# Patient Record
Sex: Male | Born: 1995 | Race: Black or African American | Hispanic: No | Marital: Single | Smoking: Never smoker
Health system: Southern US, Community
[De-identification: ages and names within clinical notes are randomized; demographics above are authoritative.]

## PROBLEM LIST (undated history)

## (undated) HISTORY — PX: NO PAST SURGERIES: SHX2092

---
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
## 2016-07-26 ENCOUNTER — Emergency Department (HOSPITAL_COMMUNITY): Payer: Medicaid Other

## 2016-07-26 ENCOUNTER — Emergency Department (HOSPITAL_COMMUNITY)
Admission: EM | Admit: 2016-07-26 | Discharge: 2016-07-26 | Disposition: A | Discharge status: Home / Self Care | Payer: Medicaid Other | Attending: Emergency Medicine | Admitting: Emergency Medicine

## 2016-07-26 ENCOUNTER — Encounter (HOSPITAL_COMMUNITY): Payer: Self-pay | Admitting: *Deleted

## 2016-07-26 DIAGNOSIS — S29011A Strain of muscle and tendon of front wall of thorax, initial encounter: Secondary | ICD-10-CM | POA: Diagnosis not present

## 2016-07-26 DIAGNOSIS — Y929 Unspecified place or not applicable: Secondary | ICD-10-CM | POA: Diagnosis not present

## 2016-07-26 DIAGNOSIS — Y9389 Activity, other specified: Secondary | ICD-10-CM | POA: Diagnosis not present

## 2016-07-26 DIAGNOSIS — X509XXA Other and unspecified overexertion or strenuous movements or postures, initial encounter: Secondary | ICD-10-CM | POA: Insufficient documentation

## 2016-07-26 DIAGNOSIS — S299XXA Unspecified injury of thorax, initial encounter: Secondary | ICD-10-CM | POA: Diagnosis present

## 2016-07-26 DIAGNOSIS — Y999 Unspecified external cause status: Secondary | ICD-10-CM | POA: Insufficient documentation

## 2016-07-26 MED ORDER — IBUPROFEN 600 MG PO TABS
600.0000 mg | ORAL_TABLET | Freq: Four times a day (QID) | ORAL | 0 refills | Status: AC | PRN
Start: 2016-07-26 — End: ?

## 2016-07-26 MED ORDER — METHOCARBAMOL 500 MG PO TABS: 500.0000 mg | ORAL_TABLET | Freq: Every evening | ORAL | 0 refills | Status: DC | PRN

## 2016-07-26 MED ORDER — IBUPROFEN 200 MG PO TABS
600.0000 mg | ORAL_TABLET | Freq: Once | ORAL | Status: AC
  Administered 2016-07-26: 600 mg via ORAL
  Filled 2016-07-26: qty 1

## 2016-07-26 NOTE — ED Provider Notes (Signed)
[facility]-EMERGENCY DEPT Provider Note   CSN: [id number] Arrival date & time: 07/26/16  1925  By signing my name below, I, Linna DarnerRussell Turner, attest that this documentation has been prepared under the direction and in the presence of Terance HartKelly Tallie Hevia, PA-C. Electronically Signed: Linna Darnerussell Turner, Scribe. 07/26/2016. 8:36 PM.  History   Chief Complaint Chief Complaint  Patient presents with  . Shoulder Injury    The history is provided by the patient. No language interpreter was used.     HPI Comments: Jim Shaffer is a 20 y.o. male who presents to the Emergency Department complaining of sudden onset, constant, anterior right shoulder pain beginning shortly PTA. Pt reports he was bench pressing and felt something rip in his right shoulder. He notes pain in his upper right chest area as well. Pt endorses severe pain with raising his right arm and states he cannot raise it very high due to pain. He denies h/o right shoulder injury. No medications or treatments tried PTA. He denies numbness, color change, wounds, right elbow pain, right forearm pain, or any other associated symptoms. He states he has been to [facility] in the past and intends to follow up with them soon.  History reviewed. No pertinent past medical history.  There are no active problems to display for this patient.   History reviewed. No pertinent surgical history.     Home Medications    Prior to Admission medications   Medication Sig Start Date End Date Taking? Authorizing Provider  HYDROcodone-acetaminophen (NORCO/VICODIN) 5-325 MG per tablet Take 1 tablet by mouth every 4 (four) hours as needed for pain. 06/07/12   Viviano SimasLauren Robinson, NP    Family History No family history on file.  Social History Social History  Substance Use Topics  . Smoking status: Never Smoker  . Smokeless tobacco: Never Used  . Alcohol use No     Allergies   Patient has no known allergies.   Review of Systems Review of Systems    Cardiovascular: Positive for chest pain (upper right).  Musculoskeletal: Positive for myalgias (right shoulder).  Skin: Negative for color change and wound.  Neurological: Negative for numbness.     Physical Exam Updated Vital Signs BP 137/92 (BP Location: Left Arm)   Pulse 84   Temp 98.3 F (36.8 C) (Oral)   Resp 20   Ht 6\' 1"  (1.854 m)   Wt 166 lb (75.3 kg)   SpO2 97%   BMI 21.90 kg/m   Physical Exam  Constitutional: He is oriented to person, place, and time. He appears well-developed and well-nourished. No distress.  HENT:  Head: Normocephalic and atraumatic.  Eyes: Conjunctivae and EOM are normal.  Neck: Neck supple. No tracheal deviation present.  Cardiovascular: Normal rate.   Pulmonary/Chest: Effort normal. No respiratory distress.  Musculoskeletal: Normal range of motion.  Right shoulder and right chest wall: No swelling appreciated. Tenderness of anterior shoulder and pectoralis muscle. Decreased ROM of shoulder due to pain. 4/5 strength of right extremity. N/V intact  Neurological: He is alert and oriented to person, place, and time.  Skin: Skin is warm and dry.  Psychiatric: He has a normal mood and affect. His behavior is normal.  Nursing note and vitals reviewed.    ED Treatments / Results  Labs (all labs ordered are listed, but only abnormal results are displayed) Labs Reviewed - No data to display  EKG  EKG Interpretation None       Radiology Dg Shoulder Right  Result Date: 07/26/2016 CLINICAL  DATA:  Right pectoral pain after bench pressing felt a tear EXAM: RIGHT SHOULDER - 2+ VIEW COMPARISON:  06/07/2012 FINDINGS: There is no evidence of fracture or dislocation. There is no evidence of arthropathy or other focal bone abnormality. Soft tissues are unremarkable. IMPRESSION: Negative. Electronically Signed   By: Jasmine Pang M.D.   On: 07/26/2016 21:19    Procedures Procedures (including critical care time)  DIAGNOSTIC STUDIES: Oxygen  Saturation is 97% on RA, normal by my interpretation.    COORDINATION OF CARE: 8:40 PM Discussed treatment plan with pt at bedside and pt agreed to plan.  Medications Ordered in ED Medications - No data to display   Initial Impression / Assessment and Plan / ED Course  I have reviewed the triage vital signs and the nursing notes.  Pertinent labs & imaging results that were available during my care of the patient were reviewed by me and considered in my medical decision making (see chart for details).  Clinical Course    20 year old male with R pectoralis strain vs ligament tear. Xray negative. Sling given. Pain treated in ED. He is an established patient with [facility]. Advised pain medicine, muscle relaxer, and follow up. Patient is NAD, non-toxic, with stable VS. Patient is informed of clinical course, understands medical decision making process, and agrees with plan. Opportunity for questions provided and all questions answered. Return precautions given.   I personally performed the services described in this documentation, which was scribed in my presence. The recorded information has been reviewed and is accurate.   Final Clinical Impressions(s) / ED Diagnoses   Final diagnoses:  Strain of right pectoralis muscle, initial encounter    New Prescriptions New Prescriptions   No medications on file     Bethel Born, PA-C 07/26/16 2217    Eber Hong, MD 07/27/16 2125

## 2016-07-26 NOTE — Discharge Instructions (Signed)
Rest as much as possible Ice for 20 minutes at a time, several times a day Use sling for comfort Take Ibuprofen and muscle relaxer Follow up with Orthopedics

## 2016-07-26 NOTE — ED Triage Notes (Signed)
The pt is c/o pain in his rt shoulder just pta  He was ilfting 250lbs of weight when he felt something tear  Pain with movement and his movement is limited.  He normally lifts 185 lbs

## 2016-08-31 ENCOUNTER — Encounter (HOSPITAL_BASED_OUTPATIENT_CLINIC_OR_DEPARTMENT_OTHER): Payer: Self-pay | Admitting: *Deleted

## 2016-09-06 NOTE — H&P (Signed)
[facility] [address]   [address] [place] [id number] [phone] A Division of [facility]                                                                    RE: Jim Shaffer, Jim   [phone]   11-30-1995 08-26-16 Reason for visit: Follow up, referral from Dr. [facility], with a right pec muscle rupture.   History of present illness: This happened in mid November.  He was bench pressing and felt a tear and pop in his shoulder.  He has had bruising and swelling down his arm.  He has an MRI that demonstrates a mildly retracted torn pec muscle from his humerus.   EXAMINATION: Well appearing male in no apparent distress.  He has a palpable defect in his pec with some sag of this.  He has a palpable tendon stump as well in his chest.  He has limited strength and motion.    X-RAYS: MRI results noted above.  ASSESSMENT/PLAN: Given his young age and active lifestyle we recommend repair of this.  I discussed this with Dr. Richardson Landryan [facility] who will assist with this.    Jewel Baizeimothy D.  [facility]ul[facility], M.D.  Electronically verified by Jewel Baizeimothy D. [facility]ulah [facility], M.D. TDM:jjh D 08-26-16 T 08-31-16

## 2016-09-08 ENCOUNTER — Encounter (HOSPITAL_BASED_OUTPATIENT_CLINIC_OR_DEPARTMENT_OTHER)
Admission: RE | Disposition: A | Discharge status: Home / Self Care | Payer: Self-pay | Source: Ambulatory Visit | Attending: Orthopedic Surgery

## 2016-09-08 ENCOUNTER — Ambulatory Visit (HOSPITAL_BASED_OUTPATIENT_CLINIC_OR_DEPARTMENT_OTHER): Payer: Medicaid Other | Admitting: Anesthesiology

## 2016-09-08 ENCOUNTER — Ambulatory Visit (HOSPITAL_BASED_OUTPATIENT_CLINIC_OR_DEPARTMENT_OTHER)
Admission: RE | Admit: 2016-09-08 | Discharge: 2016-09-08 | Disposition: A | Discharge status: Home / Self Care | Payer: Medicaid Other | Source: Ambulatory Visit | Attending: Orthopedic Surgery | Admitting: Orthopedic Surgery

## 2016-09-08 ENCOUNTER — Encounter (HOSPITAL_BASED_OUTPATIENT_CLINIC_OR_DEPARTMENT_OTHER): Payer: Self-pay | Admitting: *Deleted

## 2016-09-08 DIAGNOSIS — S29011A Strain of muscle and tendon of front wall of thorax, initial encounter: Secondary | ICD-10-CM

## 2016-09-08 DIAGNOSIS — X503XXA Overexertion from repetitive movements, initial encounter: Secondary | ICD-10-CM | POA: Insufficient documentation

## 2016-09-08 DIAGNOSIS — S46811A Strain of other muscles, fascia and tendons at shoulder and upper arm level, right arm, initial encounter: Secondary | ICD-10-CM | POA: Insufficient documentation

## 2016-09-08 HISTORY — PX: PECTORALIS TENDON REPAIR: SHX6510

## 2016-09-08 SURGERY — REPAIR, TENDON, PECTORALIS
Anesthesia: Regional | Laterality: Right

## 2016-09-08 MED ORDER — ACETAMINOPHEN 500 MG PO TABS
1000.0000 mg | ORAL_TABLET | Freq: Once | ORAL | Status: AC
  Administered 2016-09-08: 1000 mg via ORAL

## 2016-09-08 MED ORDER — BUPIVACAINE HCL (PF) 0.25 % IJ SOLN
INTRAMUSCULAR | Status: AC
  Filled 2016-09-08: qty 30

## 2016-09-08 MED ORDER — CEFAZOLIN SODIUM-DEXTROSE 2-4 GM/100ML-% IV SOLN
INTRAVENOUS | Status: AC
  Filled 2016-09-08: qty 100

## 2016-09-08 MED ORDER — FENTANYL CITRATE (PF) 100 MCG/2ML IJ SOLN
50.0000 ug | INTRAMUSCULAR | Status: DC | PRN
  Administered 2016-09-08: 100 ug via INTRAVENOUS
  Administered 2016-09-08: 50 ug via INTRAVENOUS

## 2016-09-08 MED ORDER — ARTIFICIAL TEARS OP OINT
TOPICAL_OINTMENT | OPHTHALMIC | Status: DC | PRN
  Administered 2016-09-08: 1 via OPHTHALMIC

## 2016-09-08 MED ORDER — SCOPOLAMINE 1 MG/3DAYS TD PT72
MEDICATED_PATCH | TRANSDERMAL | Status: AC
  Filled 2016-09-08: qty 1

## 2016-09-08 MED ORDER — MIDAZOLAM HCL 2 MG/2ML IJ SOLN
1.0000 mg | INTRAMUSCULAR | Status: DC | PRN
  Administered 2016-09-08: 2 mg via INTRAVENOUS

## 2016-09-08 MED ORDER — CEFAZOLIN SODIUM-DEXTROSE 2-4 GM/100ML-% IV SOLN
2.0000 g | INTRAVENOUS | Status: AC
  Administered 2016-09-08: 2 g via INTRAVENOUS

## 2016-09-08 MED ORDER — ACETAMINOPHEN 500 MG PO TABS
ORAL_TABLET | ORAL | Status: AC
  Filled 2016-09-08: qty 2

## 2016-09-08 MED ORDER — CHLORHEXIDINE GLUCONATE 4 % EX LIQD: 60.0000 mL | Freq: Once | CUTANEOUS | Status: DC

## 2016-09-08 MED ORDER — SUCCINYLCHOLINE CHLORIDE 20 MG/ML IJ SOLN
INTRAMUSCULAR | Status: DC | PRN
  Administered 2016-09-08: 120 mg via INTRAVENOUS

## 2016-09-08 MED ORDER — SCOPOLAMINE 1 MG/3DAYS TD PT72
1.0000 | MEDICATED_PATCH | Freq: Once | TRANSDERMAL | Status: DC | PRN
  Administered 2016-09-08: 1.5 mg via TRANSDERMAL

## 2016-09-08 MED ORDER — LACTATED RINGERS IV SOLN
INTRAVENOUS | Status: DC
  Administered 2016-09-08: 07:00:00 via INTRAVENOUS

## 2016-09-08 MED ORDER — METHOCARBAMOL 500 MG PO TABS: 500.0000 mg | ORAL_TABLET | Freq: Four times a day (QID) | ORAL | 0 refills | Status: AC | PRN

## 2016-09-08 MED ORDER — DEXAMETHASONE SODIUM PHOSPHATE 10 MG/ML IJ SOLN
INTRAMUSCULAR | Status: AC
  Filled 2016-09-08: qty 1

## 2016-09-08 MED ORDER — LACTATED RINGERS IV SOLN
INTRAVENOUS | Status: DC
  Administered 2016-09-08 (×2): via INTRAVENOUS

## 2016-09-08 MED ORDER — ONDANSETRON HCL 4 MG/2ML IJ SOLN
INTRAMUSCULAR | Status: AC
  Filled 2016-09-08: qty 2

## 2016-09-08 MED ORDER — ONDANSETRON HCL 4 MG/2ML IJ SOLN
INTRAMUSCULAR | Status: DC | PRN
  Administered 2016-09-08: 4 mg via INTRAVENOUS

## 2016-09-08 MED ORDER — PROMETHAZINE HCL 25 MG/ML IJ SOLN: 6.2500 mg | INTRAMUSCULAR | Status: DC | PRN

## 2016-09-08 MED ORDER — PROPOFOL 500 MG/50ML IV EMUL
INTRAVENOUS | Status: AC
  Filled 2016-09-08: qty 50

## 2016-09-08 MED ORDER — FENTANYL CITRATE (PF) 100 MCG/2ML IJ SOLN
INTRAMUSCULAR | Status: AC
  Filled 2016-09-08: qty 2

## 2016-09-08 MED ORDER — BUPIVACAINE-EPINEPHRINE (PF) 0.5% -1:200000 IJ SOLN
INTRAMUSCULAR | Status: DC | PRN
  Administered 2016-09-08: 30 mL via PERINEURAL

## 2016-09-08 MED ORDER — HYDROMORPHONE HCL 2 MG PO TABS: 2.0000 mg | ORAL_TABLET | Freq: Two times a day (BID) | ORAL | 0 refills | Status: AC | PRN

## 2016-09-08 MED ORDER — DEXAMETHASONE SODIUM PHOSPHATE 4 MG/ML IJ SOLN
INTRAMUSCULAR | Status: DC | PRN
  Administered 2016-09-08: 10 mg via INTRAVENOUS

## 2016-09-08 MED ORDER — LIDOCAINE 2% (20 MG/ML) 5 ML SYRINGE
INTRAMUSCULAR | Status: AC
  Filled 2016-09-08: qty 5

## 2016-09-08 MED ORDER — HYDROMORPHONE HCL 1 MG/ML IJ SOLN
INTRAMUSCULAR | Status: AC
  Filled 2016-09-08: qty 1

## 2016-09-08 MED ORDER — PROPOFOL 10 MG/ML IV BOLUS
INTRAVENOUS | Status: DC | PRN
  Administered 2016-09-08: 200 mg via INTRAVENOUS

## 2016-09-08 MED ORDER — OXYCODONE-ACETAMINOPHEN 5-325 MG PO TABS: 1.0000 | ORAL_TABLET | ORAL | 0 refills | Status: AC | PRN

## 2016-09-08 MED ORDER — SUCCINYLCHOLINE CHLORIDE 200 MG/10ML IV SOSY
PREFILLED_SYRINGE | INTRAVENOUS | Status: AC
  Filled 2016-09-08: qty 10

## 2016-09-08 MED ORDER — ONDANSETRON HCL 4 MG PO TABS: 4.0000 mg | ORAL_TABLET | Freq: Three times a day (TID) | ORAL | 0 refills | Status: AC | PRN

## 2016-09-08 MED ORDER — ARTIFICIAL TEARS OP OINT
TOPICAL_OINTMENT | OPHTHALMIC | Status: AC
  Filled 2016-09-08: qty 3.5

## 2016-09-08 MED ORDER — MIDAZOLAM HCL 2 MG/2ML IJ SOLN
INTRAMUSCULAR | Status: AC
  Filled 2016-09-08: qty 2

## 2016-09-08 MED ORDER — HYDROMORPHONE HCL 1 MG/ML IJ SOLN
0.2500 mg | INTRAMUSCULAR | Status: DC | PRN
  Administered 2016-09-08: 0.5 mg via INTRAVENOUS

## 2016-09-08 MED ORDER — BUPIVACAINE HCL (PF) 0.5 % IJ SOLN
INTRAMUSCULAR | Status: AC
  Filled 2016-09-08: qty 30

## 2016-09-08 SURGICAL SUPPLY — 61 items
AID PSTN UNV HD RSTRNT DISP (MISCELLANEOUS) ×1
BLADE CLIPPER SURG (BLADE) IMPLANT
BLADE SURG 15 STRL LF DISP TIS (BLADE) ×2 IMPLANT
BLADE SURG 15 STRL SS (BLADE) ×6
CHLORAPREP W/TINT 26ML (MISCELLANEOUS) ×3 IMPLANT
CLOSURE STERI-STRIP 1/2X4 (GAUZE/BANDAGES/DRESSINGS)
CLSR STERI-STRIP ANTIMIC 1/2X4 (GAUZE/BANDAGES/DRESSINGS) IMPLANT
DECANTER SPIKE VIAL GLASS SM (MISCELLANEOUS) IMPLANT
DRAPE IMP U-DRAPE 54X76 (DRAPES) ×3 IMPLANT
DRAPE INCISE IOBAN 66X45 STRL (DRAPES) ×3 IMPLANT
DRAPE OEC MINIVIEW 54X84 (DRAPES) IMPLANT
DRAPE U-SHAPE 47X51 STRL (DRAPES) ×5 IMPLANT
DRAPE U-SHAPE 76X120 STRL (DRAPES) ×6 IMPLANT
DRSG MEPILEX BORDER 4X8 (GAUZE/BANDAGES/DRESSINGS) ×2 IMPLANT
DRSG PAD ABDOMINAL 8X10 ST (GAUZE/BANDAGES/DRESSINGS) ×2 IMPLANT
DRSG TEGADERM 4X4.75 (GAUZE/BANDAGES/DRESSINGS) IMPLANT
ELECT REM PT RETURN 9FT ADLT (ELECTROSURGICAL) ×3
ELECTRODE REM PT RTRN 9FT ADLT (ELECTROSURGICAL) ×1 IMPLANT
GAUZE SPONGE 4X4 12PLY STRL (GAUZE/BANDAGES/DRESSINGS) IMPLANT
GAUZE XEROFORM 1X8 LF (GAUZE/BANDAGES/DRESSINGS) IMPLANT
GLOVE BIO SURGEON STRL SZ7.5 (GLOVE) ×6 IMPLANT
GLOVE BIOGEL PI IND STRL 8 (GLOVE) ×2 IMPLANT
GLOVE BIOGEL PI INDICATOR 8 (GLOVE) ×4
GOWN STRL REUS W/ TWL LRG LVL3 (GOWN DISPOSABLE) ×1 IMPLANT
GOWN STRL REUS W/ TWL XL LVL3 (GOWN DISPOSABLE) ×2 IMPLANT
GOWN STRL REUS W/TWL LRG LVL3 (GOWN DISPOSABLE) ×3
GOWN STRL REUS W/TWL XL LVL3 (GOWN DISPOSABLE) ×9
IMPL KIT LG PECW/BUTTON 3PK (Orthopedic Implant) IMPLANT
IMPLANT KIT LG PECW/BUTTON 3PK (Orthopedic Implant) ×3 IMPLANT
NS IRRIG 1000ML POUR BTL (IV SOLUTION) ×3 IMPLANT
PACK ARTHROSCOPY DSU (CUSTOM PROCEDURE TRAY) ×3 IMPLANT
PACK BASIN DAY SURGERY FS (CUSTOM PROCEDURE TRAY) ×3 IMPLANT
PENCIL BUTTON HOLSTER BLD 10FT (ELECTRODE) ×3 IMPLANT
RESTRAINT HEAD UNIVERSAL NS (MISCELLANEOUS) ×3 IMPLANT
SLEEVE SCD COMPRESS KNEE MED (MISCELLANEOUS) ×3 IMPLANT
SLING ARM FOAM STRAP LRG (SOFTGOODS) IMPLANT
SLING ARM IMMOBILIZER LRG (SOFTGOODS) ×2 IMPLANT
SLING ARM IMMOBILIZER MED (SOFTGOODS) IMPLANT
SLING ARM MED ADULT FOAM STRAP (SOFTGOODS) IMPLANT
SLING ARM XL FOAM STRAP (SOFTGOODS) IMPLANT
SPONGE GAUZE 4X4 12PLY STER LF (GAUZE/BANDAGES/DRESSINGS) IMPLANT
SPONGE LAP 18X18 X RAY DECT (DISPOSABLE) ×3 IMPLANT
STRIP SUTURE WOUND CLOSURE 1/2 (SUTURE) ×2 IMPLANT
SUCTION FRAZIER HANDLE 10FR (MISCELLANEOUS) ×2
SUCTION TUBE FRAZIER 10FR DISP (MISCELLANEOUS) IMPLANT
SUT ETHILON 3 0 PS 1 (SUTURE) IMPLANT
SUT FIBERWIRE #2 38 T-5 BLUE (SUTURE)
SUT MON AB 2-0 CT1 36 (SUTURE) ×3 IMPLANT
SUT MON AB 4-0 PC3 18 (SUTURE) ×2 IMPLANT
SUT VIC AB 0 CT1 27 (SUTURE) ×3
SUT VIC AB 0 CT1 27XBRD ANBCTR (SUTURE) ×1 IMPLANT
SUT VIC AB 2-0 SH 27 (SUTURE)
SUT VIC AB 2-0 SH 27XBRD (SUTURE) IMPLANT
SUT VIC AB 3-0 SH 27 (SUTURE)
SUT VIC AB 3-0 SH 27X BRD (SUTURE) IMPLANT
SUTURE FIBERWR #2 38 T-5 BLUE (SUTURE) IMPLANT
SYR BULB 3OZ (MISCELLANEOUS) ×3 IMPLANT
TAPE CLOTH SURG 4X10 WHT LF (GAUZE/BANDAGES/DRESSINGS) ×2 IMPLANT
TOWEL OR 17X24 6PK STRL BLUE (TOWEL DISPOSABLE) ×3 IMPLANT
TOWEL OR NON WOVEN STRL DISP B (DISPOSABLE) ×3 IMPLANT
YANKAUER SUCT BULB TIP NO VENT (SUCTIONS) ×3 IMPLANT

## 2016-09-08 NOTE — Anesthesia Procedure Notes (Signed)
Procedure Name: Intubation Date/Time: 09/08/2016 7:32 AM Performed by: Curly ShoresRAFT, Guilianna Mckoy W Pre-anesthesia Checklist: Patient identified, Emergency Drugs available, Suction available and Patient being monitored Patient Re-evaluated:Patient Re-evaluated prior to inductionOxygen Delivery Method: Circle system utilized Preoxygenation: Pre-oxygenation with 100% oxygen Intubation Type: IV induction Ventilation: Mask ventilation without difficulty Laryngoscope Size: Miller and 2 Grade View: Grade I Tube type: Oral Tube size: 8.0 mm Number of attempts: 1 Airway Equipment and Method: Stylet Placement Confirmation: ETT inserted through vocal cords under direct vision,  positive ETCO2 and breath sounds checked- equal and bilateral Secured at: 22 cm Tube secured with: Tape Dental Injury: Teeth and Oropharynx as per pre-operative assessment

## 2016-09-08 NOTE — Transfer of Care (Signed)
Immediate Anesthesia Transfer of Care Note  Patient: Jim Shaffer  Procedure(s) Performed: Procedure(s): RIGHT PECTORALIS TENDON REPAIR (Right)  Patient Location: PACU  Anesthesia Type:GA combined with regional for post-op pain  Level of Consciousness: awake, alert  and patient cooperative  Airway & Oxygen Therapy: Patient Spontanous Breathing and Patient connected to face mask oxygen  Post-op Assessment: Report given to RN, Post -op Vital signs reviewed and stable and Patient moving all extremities  Post vital signs: Reviewed and stable  Last Vitals:  Vitals:   09/08/16 0704 09/08/16 0916  BP:  (!) 136/94  Pulse: 72 (!) 106  Resp: 18 (!) 29  Temp:      Last Pain:  Vitals:   09/08/16 0634  TempSrc: Oral      Patients Stated Pain Goal: 0 (09/08/16 [id number])  Complications: No apparent anesthesia complications

## 2016-09-08 NOTE — Progress Notes (Signed)
Assisted Dr. Singer with right, ultrasound guided, interscalene  block. Side rails up, monitors on throughout procedure. See vital signs in flow sheet. Tolerated Procedure well. 

## 2016-09-08 NOTE — Anesthesia Postprocedure Evaluation (Addendum)
Anesthesia Post Note  Patient: Jim Shaffer  Procedure(s) Performed: Procedure(s) (LRB): RIGHT PECTORALIS TENDON REPAIR (Right)  Patient location during evaluation: PACU Anesthesia Type: Regional Level of consciousness: sedated Pain management: pain level controlled Vital Signs Assessment: post-procedure vital signs reviewed and stable Respiratory status: spontaneous breathing and respiratory function stable Cardiovascular status: stable Anesthetic complications: no       Last Vitals:  Vitals:   09/08/16 1100 09/08/16 1115  BP: (!) 138/94 (!) 133/96  Pulse: 79 79  Resp: 14 14  Temp: 37.2 C 37.2 C    Last Pain:  Vitals:   09/08/16 1115  TempSrc:   PainSc: 2                  Christabel Camire DANIEL

## 2016-09-08 NOTE — Anesthesia Procedure Notes (Addendum)
Anesthesia Regional Block:  Interscalene brachial plexus block  Pre-Anesthetic Checklist: ,, timeout performed, Correct Patient, Correct Site, Correct Laterality, Correct Procedure, Correct Position, site marked, Risks and benefits discussed,  Surgical consent,  Pre-op evaluation,  At surgeon's request and post-op pain management  Laterality: Right  Prep: chloraprep       Needles:  Injection technique: Single-shot  Needle Type: Echogenic Stimulator Needle     Needle Length: 5cm 5 cm Needle Gauge: 22 and 22 G    Additional Needles:  Procedures: ultrasound guided (picture in chart) and nerve stimulator Interscalene brachial plexus block  Nerve Stimulator or Paresthesia:  Response: bicep contraction, 0.45 mA,   Additional Responses:   Narrative:  Start time: 09/08/2016 6:56 AM End time: 09/08/2016 7:06 AM Injection made incrementally with aspirations every 5 mL.  Performed by: Personally  Anesthesiologist: Heather RobertsSINGER, Jaycen Vercher  Additional Notes: Functioning IV was confirmed and monitors applied.  A 50mm 22ga echogenic arrow stimulator was used. Sterile prep and drape,hand hygiene and sterile gloves were used.Ultrasound guidance: relevant anatomy identified, needle position confirmed, local anesthetic spread visualized around nerve(s)., vascular puncture avoided.  Image printed for medical record.  Negative aspiration and negative test dose prior to incremental administration of local anesthetic. The patient tolerated the procedure well.

## 2016-09-08 NOTE — Discharge Instructions (Signed)
Diet: As you were doing prior to hospitalization   Wear sling at all times.  Shower: Keep the dressings on and dry, use an occlusive plastic wrap if needed, NO SOAKING IN TUB.    Dressing:  Leave dressing on and dry until follow up  Activity:  Increase activity slowly as tolerated, but follow the weight bearing instructions below.  The rules on driving is that you can not be taking narcotics while you drive, and you must feel in control of the vehicle.    Weight Bearing:   Non weight bearing Right arm  To prevent constipation: you may use a stool softener such as -  Colace (over the counter) 100 mg by mouth twice a day  Drink plenty of fluids (prune juice may be helpful) and high fiber foods Miralax (over the counter) for constipation as needed.    Itching:  If you experience itching with your medications, try taking only a single pain pill, or even half a pain pill at a time.  You may take up to 10 pain pills per day, and you can also use benadryl over the counter for itching or also to help with sleep.   Precautions:  If you experience chest pain or shortness of breath - call 911 immediately for transfer to the hospital emergency department!!  If you develop a fever greater that 101 F, purulent drainage from wound, increased redness or drainage from wound, or calf pain -- Call the office at [phone]                                                Follow- Up Appointment:  Please call for an appointment to be seen in 2 weeks [place] - [phone]     Regional Anesthesia Blocks  1. Numbness or the inability to move the "blocked" extremity may last from 3-48 hours after placement. The length of time depends on the medication injected and your individual response to the medication. If the numbness is not going away after 48 hours, call your surgeon.  2. The extremity that is blocked will need to be protected until the numbness is gone and the  Strength has returned. Because you  cannot feel it, you will need to take extra care to avoid injury. Because it may be weak, you may have difficulty moving it or using it. You may not know what position it is in without looking at it while the block is in effect.  3. For blocks in the legs and feet, returning to weight bearing and walking needs to be done carefully. You will need to wait until the numbness is entirely gone and the strength has returned. You should be able to move your leg and foot normally before you try and bear weight or walk. You will need someone to be with you when you first try to ensure you do not fall and possibly risk injury.  4. Bruising and tenderness at the needle site are common side effects and will resolve in a few days.  5. Persistent numbness or new problems with movement should be communicated to the surgeon or the [facility] [phone])/ [facility] [phone]).      Post Anesthesia Home Care Instructions  Activity: Get plenty of rest for the remainder of the day. A responsible adult should stay with you  for 24 hours following the procedure.  For the next 24 hours, DO NOT: -Drive a car -Advertising copywriter -Drink alcoholic beverages -Take any medication unless instructed by your physician -Make any legal decisions or sign important papers.  Meals: Start with liquid foods such as gelatin or soup. Progress to regular foods as tolerated. Avoid greasy, spicy, heavy foods. If nausea and/or vomiting occur, drink only clear liquids until the nausea and/or vomiting subsides. Call your physician if vomiting continues.  Special Instructions/Symptoms: Your throat may feel dry or sore from the anesthesia or the breathing tube placed in your throat during surgery. If this causes discomfort, gargle with warm salt water. The discomfort should disappear within 24 hours.  If you had a scopolamine patch placed behind your ear for the management of post- operative nausea  and/or vomiting:  1. The medication in the patch is effective for 72 hours, after which it should be removed.  Wrap patch in a tissue and discard in the trash. Wash hands thoroughly with soap and water. 2. You may remove the patch earlier than 72 hours if you experience unpleasant side effects which may include dry mouth, dizziness or visual disturbances. 3. Avoid touching the patch. Wash your hands with soap and water after contact with the patch.

## 2016-09-08 NOTE — Op Note (Signed)
09/08/2016  10:50 AM  PATIENT:  Jim Shaffer    PRE-OPERATIVE DIAGNOSIS:  RIGHT PECTORALIS TENDON RUPTURE  POST-OPERATIVE DIAGNOSIS:  Same  PROCEDURE:  RIGHT PECTORALIS TENDON REPAIR  SURGEON:  Helga Asbury, Jewel BaizeIMOTHY D, MD  ASSISTANT: Aquilla HackerHenry Martensen, PA-C, he was present and scrubbed throughout the case, critical for completion in a timely fashion, and for retraction, instrumentation, and closure.   ANESTHESIA:   gen  PREOPERATIVE INDICATIONS:  Jim Penmansaiah Thumm is a  21 y.o. male with a diagnosis of RIGHT PECTORALIS TENDON RUPTURE who failed conservative measures and elected for surgical management.    The risks benefits and alternatives were discussed with the patient preoperatively including but not limited to the risks of infection, bleeding, nerve injury, cardiopulmonary complications, the need for revision surgery, among others, and the patient was willing to proceed.  OPERATIVE IMPLANTS: endobuttons x 2  OPERATIVE FINDINGS: tendon repair  BLOOD LOSS: min  COMPLICATIONS: none  TOURNIQUET TIME: none  OPERATIVE PROCEDURE:  Patient was identified in the preoperative holding area and site was marked by me He was transported to the operating theater and placed on the table in supine position taking care to pad all bony prominences. After a preincinduction time out anesthesia was induced. The right shoulder extremity was prepped and draped in normal sterile fashion and a pre-incision timeout was performed. He received ancef for preoperative antibiotics.   His placed in the beachchair position again all bony prominences were padded right shoulder was prepped and draped in normal sterile fashion I made a deltopectoral incision I protected the cephalic vein and dissected down to his deltopectoral interval. Identified his rupture pec tendon fortunately had a few fibers tachypnea from retracting fully. I was able to free up and mobilize his distal tendon I placed 2 #5 fiber wires whipstitched into  the tendon and muscle stump. These had an excellent hold on this.  Next identified his biceps tendon and insertion point of his Patrick tendon. He had a few fibers remaining on the stump at the bone I freed these are cleared these from the bone for placement of the Endobutton's. I drilled 2 unicortical holes in the bone for passing of these buttons I threaded our stitches through each button.  Next I deployed both buttons intramedullary with an excellent hold on the cortex. I then tensioned each stitches walking the tendon down to the bone and had excellent apposition here. I then locked the stitches with one more throat to the tendon and secured them with multiple knots. I repeated this for the second button and set of stitches.  He had app he had excellent apposition no stressing with internal/external rotation to neutral.  I then thoroughly irrigated his wound and closed his incision in layers.  Sterile dressing was applied he was awoken and taken the PACU in stable condition  POST OPERATIVE PLAN: sling full time, mobilize for dvt px

## 2016-09-08 NOTE — Anesthesia Preprocedure Evaluation (Signed)
Anesthesia Evaluation  Patient identified by MRN, date of birth, ID band Patient awake    Reviewed: Allergy & Precautions, NPO status , Patient's Chart, lab work & pertinent test results  Airway Mallampati: II  TM Distance: >3 FB Neck ROM: Full    Dental no notable dental hx. (+) Dental Advisory Given   Pulmonary neg pulmonary ROS,    Pulmonary exam normal        Cardiovascular negative cardio ROS Normal cardiovascular exam     Neuro/Psych negative neurological ROS  negative psych ROS   GI/Hepatic negative GI ROS, Neg liver ROS,   Endo/Other  negative endocrine ROS  Renal/GU negative Renal ROS  negative genitourinary   Musculoskeletal negative musculoskeletal ROS (+)   Abdominal   Peds negative pediatric ROS (+)  Hematology negative hematology ROS (+)   Anesthesia Other Findings   Reproductive/Obstetrics negative OB ROS                             Anesthesia Physical Anesthesia Plan  ASA: I  Anesthesia Plan: General   Post-op Pain Management: GA combined w/ Regional for post-op pain   Induction: Intravenous  Airway Management Planned: Oral ETT  Additional Equipment:   Intra-op Plan:   Post-operative Plan: Extubation in OR  Informed Consent: I have reviewed the patients History and Physical, chart, labs and discussed the procedure including the risks, benefits and alternatives for the proposed anesthesia with the patient or authorized representative who has indicated his/her understanding and acceptance.   Dental advisory given  Plan Discussed with: CRNA and Anesthesiologist  Anesthesia Plan Comments:         Anesthesia Quick Evaluation

## 2016-09-08 NOTE — Interval H&P Note (Signed)
History and Physical Interval Note:  09/08/2016 7:16 AM  Jim PenmanIsaiah Larcom  has presented today for surgery, with the diagnosis of RIGHT PECTORALIS TENDON RUPTURE  The various methods of treatment have been discussed with the patient and family. After consideration of risks, benefits and other options for treatment, the patient has consented to  Procedure(s): RIGHT REPAIR OF TENDON/MUSCLE UPPER ARM ELBOW EACH TENDON OR MUSCLE (Right) as a surgical intervention .  The patient's history has been reviewed, patient examined, no change in status, stable for surgery.  I have reviewed the patient's chart and labs.  Questions were answered to the patient's satisfaction.     Denise Bramblett D

## 2016-09-09 ENCOUNTER — Encounter (HOSPITAL_BASED_OUTPATIENT_CLINIC_OR_DEPARTMENT_OTHER): Payer: Self-pay | Admitting: Orthopedic Surgery

## 2017-02-04 NOTE — Addendum Note (Signed)
Addendum  created 02/04/17 0752 by Kamilo Och, MD   Sign clinical note    

## 2017-02-04 NOTE — Addendum Note (Signed)
Addendum  created 02/04/17 1003 by Bernon Arviso, MD   Sign clinical note    

## 2017-09-16 ENCOUNTER — Other Ambulatory Visit: Payer: Self-pay

## 2017-09-16 ENCOUNTER — Emergency Department (HOSPITAL_COMMUNITY)
Admission: EM | Admit: 2017-09-16 | Discharge: 2017-09-17 | Disposition: A | Discharge status: Home / Self Care | Payer: Self-pay | Attending: Emergency Medicine | Admitting: Emergency Medicine

## 2017-09-16 ENCOUNTER — Encounter (HOSPITAL_COMMUNITY): Payer: Self-pay | Admitting: *Deleted

## 2017-09-16 DIAGNOSIS — J020 Streptococcal pharyngitis: Secondary | ICD-10-CM | POA: Insufficient documentation

## 2017-09-16 DIAGNOSIS — Z79899 Other long term (current) drug therapy: Secondary | ICD-10-CM | POA: Insufficient documentation

## 2017-09-16 LAB — RAPID STREP SCREEN (MED CTR MEBANE ONLY): Streptococcus, Group A Screen (Direct): POSITIVE — AB

## 2017-09-16 MED ORDER — DEXAMETHASONE SODIUM PHOSPHATE 10 MG/ML IJ SOLN
10.0000 mg | Freq: Once | INTRAMUSCULAR | Status: AC
  Administered 2017-09-16: 10 mg via INTRAMUSCULAR
  Filled 2017-09-16: qty 1

## 2017-09-16 MED ORDER — PENICILLIN G BENZATHINE 1200000 UNIT/2ML IM SUSP
1.2000 10*6.[IU] | Freq: Once | INTRAMUSCULAR | Status: AC
  Administered 2017-09-16: 1.2 10*6.[IU] via INTRAMUSCULAR
  Filled 2017-09-16: qty 2

## 2017-09-16 NOTE — Discharge Instructions (Signed)

## 2017-09-16 NOTE — ED Provider Notes (Signed)
[facility] EMERGENCY DEPARTMENT Provider Note   CSN: [id number] Arrival date & time: 09/16/17  2235     History   Chief Complaint Chief Complaint  Patient presents with  . Sore Throat    HPI Jim Shaffer is a 22 y.o. male significant past medical history who presents to the emergency department complaining of a progressively worsening sore throat for the past 3 days.  Patient states that the pain is constant, somewhat improved with ibuprofen, worse with swallowing, however patient is able to swallow.  States that he has tried gargling with salt water and OTC throat spray without significant relief.  States he is having associated congestion and fever, temp max of 101.  Reports recent sick contact with sister who was diagnosed with strep throat.  Patient denies neck pain, ear pain, cough, chest pain, or difficulty breathing.  HPI  History reviewed. No pertinent past medical history.  There are no active problems to display for this patient.   Past Surgical History:  Procedure Laterality Date  . NO PAST SURGERIES    . PECTORALIS TENDON REPAIR Right 09/08/2016   Procedure: RIGHT PECTORALIS TENDON REPAIR;  Surgeon: Sheral Apley, MD;  Location: [facility];  Service: Orthopedics;  Laterality: Right;       Home Medications    Prior to Admission medications   Medication Sig Start Date End Date Taking? Authorizing Provider  HYDROmorphone (DILAUDID) 2 MG tablet Take 1 tablet (2 mg total) by mouth 2 (two) times daily as needed for severe pain (For Breakthrough pain only). 09/08/16   Albina Billet III, PA-C  ibuprofen (ADVIL,MOTRIN) 600 MG tablet Take 1 tablet (600 mg total) by mouth every 6 (six) hours as needed. 07/26/16   Bethel Born, PA-C  methocarbamol (ROBAXIN) 500 MG tablet Take 1 tablet (500 mg total) by mouth every 6 (six) hours as needed for muscle spasms. 09/08/16   Martensen, Lucretia Kern III, PA-C  ondansetron (ZOFRAN) 4 MG  tablet Take 1 tablet (4 mg total) by mouth every 8 (eight) hours as needed for nausea or vomiting. 09/08/16   Albina Billet III, PA-C  oxyCODONE-acetaminophen (ROXICET) 5-325 MG tablet Take 1-2 tablets by mouth every 4 (four) hours as needed for severe pain. 09/08/16   Albina Billet III, PA-C    Family History History reviewed. No pertinent family history.  Social History Social History   Tobacco Use  . Smoking status: Never Smoker  . Smokeless tobacco: Never Used  Substance Use Topics  . Alcohol use: No  . Drug use: No     Allergies   Patient has no known allergies.   Review of Systems Review of Systems  Constitutional: Positive for fever.  HENT: Positive for congestion and sore throat. Negative for drooling and ear pain.   Respiratory: Negative for shortness of breath.   Cardiovascular: Negative for chest pain.  Gastrointestinal: Negative for abdominal pain.  Musculoskeletal: Negative for neck pain.   Physical Exam Updated Vital Signs BP 134/79   Pulse 86   Temp 98.9 F (37.2 C) (Oral)   Resp 16   SpO2 100%   Physical Exam  Constitutional: He appears well-developed and well-nourished.  Non-toxic appearance. No distress.  HENT:  Head: Normocephalic and atraumatic.  Right Ear: Tympanic membrane is not perforated, not erythematous, not retracted and not bulging.  Left Ear: Tympanic membrane is not perforated, not erythematous, not retracted and not bulging.  Nose: Mucosal edema present.  Mouth/Throat: Uvula is  midline. No trismus in the jaw. Oropharyngeal exudate and posterior oropharyngeal erythema present. Tonsils are 2+ on the right. Tonsils are 2+ on the left.  Moist mucous membranes. Patient is tolerating secretions without difficulty. No drooling. No hot potato voice.   Eyes: Conjunctivae are normal. Right eye exhibits no discharge. Left eye exhibits no discharge.  Neck: Normal range of motion. Neck supple.  Submandibular compartment soft    Cardiovascular: Normal rate and regular rhythm.  No murmur heard. Pulmonary/Chest: Breath sounds normal. No respiratory distress. He has no wheezes. He has no rales.  Abdominal: Soft. He exhibits no distension. There is no tenderness.  Lymphadenopathy:    He has cervical adenopathy (anterior bilateral).  Neurological: He is alert.  Clear speech.   Skin: Skin is warm and dry. No rash noted.  Psychiatric: He has a normal mood and affect. His behavior is normal.  Nursing note and vitals reviewed.   ED Treatments / Results  Labs (all labs ordered are listed, but only abnormal results are displayed) Labs Reviewed  RAPID STREP SCREEN (NOT AT [facility]) - Abnormal; Notable for the following components:      Result Value   Streptococcus, Group A Screen (Direct) POSITIVE (*)    All other components within normal limits   EKG  EKG Interpretation None      Radiology No results found.  Procedures Procedures (including critical care time)  Medications Ordered in ED Medications  penicillin g benzathine (BICILLIN LA) 1200000 UNIT/2ML injection 1.2 Million Units (not administered)  dexamethasone (DECADRON) injection 10 mg (not administered)   Initial Impression / Assessment and Plan / ED Course  I have reviewed the triage vital signs and the nursing notes.  Pertinent labs & imaging results that were available during my care of the patient were reviewed by me and considered in my medical decision making (see chart for details).  Presents with complaint of sore throat.  Patient is nontoxic-appearing, vitals are within normal limits.  On exam patient with tonsillar erythema, exudates, and anterior cervical lymphadenopathy.  Rapid strep test is positive.  Treated in the emergency department with IM Decadron and IM Penicillin.  Exam non concerning for PTA or RPA, there is no trismus, uvular deviation, or hot potato voice. Patient is tolerating his own secretions without difficulty, full ROM of  the neck, submandibular compartment is soft. Recommended use of Tylenol and Ibuprofen for any continued discomfort or fevers. I discussed results, treatment plan, need for PCP follow-up, and return precautions with the patient. Provided opportunity for questions, patient confirmed understanding and is in agreement with plan.      Final Clinical Impressions(s) / ED Diagnoses   Final diagnoses:  Strep throat    ED Discharge Orders    None       Cherly Andersonetrucelli, [place] Fant R, PA-C 09/17/17 0005    Alvira MondaySchlossman, Erin, MD 09/17/17 1411

## 2017-09-16 NOTE — ED Triage Notes (Signed)
Pt c/o sore throat for the past couple of days. Reports his sister recently was diagnosed with strep.

## 2018-11-06 ENCOUNTER — Emergency Department (HOSPITAL_COMMUNITY): Payer: Self-pay

## 2018-11-06 ENCOUNTER — Emergency Department (HOSPITAL_COMMUNITY)
Admission: EM | Admit: 2018-11-06 | Discharge: 2018-11-06 | Disposition: A | Discharge status: Home / Self Care | Payer: Self-pay | Attending: Emergency Medicine | Admitting: Emergency Medicine

## 2018-11-06 ENCOUNTER — Encounter (HOSPITAL_COMMUNITY): Payer: Self-pay

## 2018-11-06 ENCOUNTER — Other Ambulatory Visit: Payer: Self-pay

## 2018-11-06 DIAGNOSIS — R509 Fever, unspecified: Secondary | ICD-10-CM

## 2018-11-06 DIAGNOSIS — E860 Dehydration: Secondary | ICD-10-CM | POA: Insufficient documentation

## 2018-11-06 DIAGNOSIS — R112 Nausea with vomiting, unspecified: Secondary | ICD-10-CM | POA: Insufficient documentation

## 2018-11-06 DIAGNOSIS — J101 Influenza due to other identified influenza virus with other respiratory manifestations: Secondary | ICD-10-CM | POA: Insufficient documentation

## 2018-11-06 LAB — COMPREHENSIVE METABOLIC PANEL
ALT: 27 U/L (ref 0–44)
AST: 32 U/L (ref 15–41)
Albumin: 4.3 g/dL (ref 3.5–5.0)
Alkaline Phosphatase: 70 U/L (ref 38–126)
Anion gap: 10 (ref 5–15)
BILIRUBIN TOTAL: 0.7 mg/dL (ref 0.3–1.2)
BUN: 11 mg/dL (ref 6–20)
CO2: 23 mmol/L (ref 22–32)
CREATININE: 1.23 mg/dL (ref 0.61–1.24)
Calcium: 9.3 mg/dL (ref 8.9–10.3)
Chloride: 101 mmol/L (ref 98–111)
Glucose, Bld: 102 mg/dL — ABNORMAL HIGH (ref 70–99)
Potassium: 3.9 mmol/L (ref 3.5–5.1)
Sodium: 134 mmol/L — ABNORMAL LOW (ref 135–145)
TOTAL PROTEIN: 7.4 g/dL (ref 6.5–8.1)

## 2018-11-06 LAB — CBC WITH DIFFERENTIAL/PLATELET
Abs Immature Granulocytes: 0.02 10*3/uL (ref 0.00–0.07)
BASOS PCT: 0 %
Basophils Absolute: 0 10*3/uL (ref 0.0–0.1)
EOS PCT: 0 %
Eosinophils Absolute: 0 10*3/uL (ref 0.0–0.5)
HCT: 45.2 % (ref 39.0–52.0)
HEMOGLOBIN: 14.6 g/dL (ref 13.0–17.0)
Immature Granulocytes: 0 %
Lymphocytes Relative: 9 %
Lymphs Abs: 0.5 10*3/uL — ABNORMAL LOW (ref 0.7–4.0)
MCH: 28.3 pg (ref 26.0–34.0)
MCHC: 32.3 g/dL (ref 30.0–36.0)
MCV: 87.8 fL (ref 80.0–100.0)
MONO ABS: 0.8 10*3/uL (ref 0.1–1.0)
MONOS PCT: 14 %
Neutro Abs: 4.3 10*3/uL (ref 1.7–7.7)
Neutrophils Relative %: 77 %
Platelets: 169 10*3/uL (ref 150–400)
RBC: 5.15 MIL/uL (ref 4.22–5.81)
RDW: 13 % (ref 11.5–15.5)
WBC: 5.7 10*3/uL (ref 4.0–10.5)
nRBC: 0 % (ref 0.0–0.2)

## 2018-11-06 LAB — LACTIC ACID, PLASMA
LACTIC ACID, VENOUS: 2 mmol/L — AB (ref 0.5–1.9)
LACTIC ACID, VENOUS: 1.7 mmol/L (ref 0.5–1.9)

## 2018-11-06 LAB — INFLUENZA PANEL BY PCR (TYPE A & B)
INFLAPCR: POSITIVE — AB
Influenza B By PCR: NEGATIVE

## 2018-11-06 MED ORDER — SODIUM CHLORIDE 0.9% FLUSH: 3.0000 mL | Freq: Once | INTRAVENOUS | Status: DC

## 2018-11-06 MED ORDER — ONDANSETRON HCL 4 MG/2ML IJ SOLN
4.0000 mg | Freq: Once | INTRAMUSCULAR | Status: AC
  Administered 2018-11-06: 4 mg via INTRAVENOUS
  Filled 2018-11-06: qty 2

## 2018-11-06 MED ORDER — PROMETHAZINE HCL 25 MG PO TABS: 25.0000 mg | ORAL_TABLET | Freq: Four times a day (QID) | ORAL | 0 refills | Status: AC | PRN

## 2018-11-06 MED ORDER — SODIUM CHLORIDE 0.9 % IV BOLUS
1000.0000 mL | Freq: Once | INTRAVENOUS | Status: AC
  Administered 2018-11-06: 1000 mL via INTRAVENOUS

## 2018-11-06 MED ORDER — KETOROLAC TROMETHAMINE 30 MG/ML IJ SOLN
30.0000 mg | Freq: Once | INTRAMUSCULAR | Status: AC
Start: 2018-11-06 — End: 2018-11-06
  Administered 2018-11-06: 30 mg via INTRAVENOUS
  Filled 2018-11-06: qty 1

## 2018-11-06 MED ORDER — ACETAMINOPHEN 325 MG PO TABS
650.0000 mg | ORAL_TABLET | Freq: Once | ORAL | Status: AC | PRN
  Administered 2018-11-06: 650 mg via ORAL
  Filled 2018-11-06: qty 2

## 2018-11-06 NOTE — ED Triage Notes (Signed)
Pt reports fever, headache, chills. Pt does report cough. Denies any sick contacts.

## 2018-11-06 NOTE — ED Notes (Signed)
Patient verbalizes understanding of discharge instructions. Opportunity for questioning and answers were provided. Armband removed by staff, pt discharged from ED ambulatory to home.  

## 2018-11-06 NOTE — Discharge Instructions (Addendum)
Alternate tylenol and ibuprofen for fever.  Drink lots of fluids. °

## 2018-11-06 NOTE — ED Provider Notes (Signed)
[facility] EMERGENCY DEPARTMENT Provider Note   CSN: [id number] Arrival date & time: 11/06/18  1434    History   Chief Complaint Chief Complaint  Patient presents with  . Fever    HPI Jim Shaffer is a 23 y.o. male.     Pt presents to the ED today with fever, n/v, cough.  He has been sick since yesterday.  He had a fever when he arrived and was given a tylenol.  He has not been able to keep anything down.       History reviewed. No pertinent past medical history.  There are no active problems to display for this patient.   Past Surgical History:  Procedure Laterality Date  . NO PAST SURGERIES    . PECTORALIS TENDON REPAIR Right 09/08/2016   Procedure: RIGHT PECTORALIS TENDON REPAIR;  Surgeon: Sheral Apley, MD;  Location: [facility];  Service: Orthopedics;  Laterality: Right;        Home Medications    Prior to Admission medications   Medication Sig Start Date End Date Taking? Authorizing Provider  ibuprofen (ADVIL,MOTRIN) 200 MG tablet Take 400 mg by mouth every 6 (six) hours as needed for fever or headache.   Yes [provider]  HYDROmorphone (DILAUDID) 2 MG tablet Take 1 tablet (2 mg total) by mouth 2 (two) times daily as needed for severe pain (For Breakthrough pain only). Patient not taking: Reported on 11/06/2018 09/08/16   Albina Billet III, PA-C  ibuprofen (ADVIL,MOTRIN) 600 MG tablet Take 1 tablet (600 mg total) by mouth every 6 (six) hours as needed. Patient not taking: Reported on 11/06/2018 07/26/16   Bethel Born, PA-C  methocarbamol (ROBAXIN) 500 MG tablet Take 1 tablet (500 mg total) by mouth every 6 (six) hours as needed for muscle spasms. Patient not taking: Reported on 11/06/2018 09/08/16   Albina Billet III, PA-C  ondansetron (ZOFRAN) 4 MG tablet Take 1 tablet (4 mg total) by mouth every 8 (eight) hours as needed for nausea or vomiting. Patient not taking: Reported on 11/06/2018 09/08/16    Albina Billet III, PA-C  oxyCODONE-acetaminophen (ROXICET) 5-325 MG tablet Take 1-2 tablets by mouth every 4 (four) hours as needed for severe pain. Patient not taking: Reported on 11/06/2018 09/08/16   Albina Billet III, PA-C  promethazine (PHENERGAN) 25 MG tablet Take 1 tablet (25 mg total) by mouth every 6 (six) hours as needed for nausea or vomiting. 11/06/18   Jacalyn Lefevre, MD    Family History History reviewed. No pertinent family history.  Social History Social History   Tobacco Use  . Smoking status: Never Smoker  . Smokeless tobacco: Never Used  Substance Use Topics  . Alcohol use: No  . Drug use: No     Allergies   Patient has no known allergies.   Review of Systems Review of Systems  Constitutional: Positive for fever.  Respiratory: Positive for cough.   Gastrointestinal: Positive for nausea and vomiting.     Physical Exam Updated Vital Signs BP 136/71 (BP Location: Right Arm)   Pulse (!) 103   Temp 98.8 F (37.1 C) (Oral)   Resp 16   SpO2 100%   Physical Exam Vitals signs and nursing note reviewed.  Constitutional:      Appearance: Normal appearance.  HENT:     Head: Normocephalic and atraumatic.     Right Ear: External ear normal.     Left Ear: External ear normal.  Nose: Nose normal.     Mouth/Throat:     Mouth: Mucous membranes are dry.  Eyes:     Extraocular Movements: Extraocular movements intact.     Conjunctiva/sclera: Conjunctivae normal.     Pupils: Pupils are equal, round, and reactive to light.  Neck:     Musculoskeletal: Normal range of motion and neck supple.  Cardiovascular:     Rate and Rhythm: Regular rhythm. Tachycardia present.     Pulses: Normal pulses.     Heart sounds: Normal heart sounds.  Pulmonary:     Effort: Pulmonary effort is normal.     Breath sounds: Normal breath sounds.  Abdominal:     General: Abdomen is flat.  Musculoskeletal: Normal range of motion.  Skin:    General: Skin is warm  and dry.     Capillary Refill: Capillary refill takes less than 2 seconds.  Neurological:     General: No focal deficit present.     Mental Status: He is alert and oriented to person, place, and time.  Psychiatric:        Mood and Affect: Mood normal.        Behavior: Behavior normal.        Thought Content: Thought content normal.        Judgment: Judgment normal.      ED Treatments / Results  Labs (all labs ordered are listed, but only abnormal results are displayed) Labs Reviewed  LACTIC ACID, PLASMA - Abnormal; Notable for the following components:      Result Value   Lactic Acid, Venous 2.0 (*)    All other components within normal limits  COMPREHENSIVE METABOLIC PANEL - Abnormal; Notable for the following components:   Sodium 134 (*)    Glucose, Bld 102 (*)    All other components within normal limits  CBC WITH DIFFERENTIAL/PLATELET - Abnormal; Notable for the following components:   Lymphs Abs 0.5 (*)    All other components within normal limits  INFLUENZA PANEL BY PCR (TYPE A & B) - Abnormal; Notable for the following components:   Influenza A By PCR POSITIVE (*)    All other components within normal limits  LACTIC ACID, PLASMA  URINALYSIS, ROUTINE W REFLEX MICROSCOPIC    EKG None  Radiology Dg Chest 2 View  Result Date: 11/06/2018 CLINICAL DATA:  Fever, shortness of breath, congestion and productive cough for 3 days EXAM: CHEST - 2 VIEW COMPARISON:  04/28/2010 FINDINGS: Normal heart size, mediastinal contours, and pulmonary vascularity. Lungs clear. No pulmonary infiltrate, pleural effusion, or pneumothorax. Biconvex thoracolumbar scoliosis. IMPRESSION: No acute abnormalities. Electronically Signed   By: Ulyses Southward M.D.   On: 11/06/2018 15:49    Procedures Procedures (including critical care time)  Medications Ordered in ED Medications  sodium chloride flush (NS) 0.9 % injection 3 mL (has no administration in time range)  acetaminophen (TYLENOL) tablet 650  mg (650 mg Oral Given 11/06/18 1450)  sodium chloride 0.9 % bolus 1,000 mL (1,000 mLs Intravenous New Bag/Given 11/06/18 2003)  ondansetron (ZOFRAN) injection 4 mg (4 mg Intravenous Given 11/06/18 2000)  ketorolac (TORADOL) 30 MG/ML injection 30 mg (30 mg Intravenous Given 11/06/18 2000)     Initial Impression / Assessment and Plan / ED Course  I have reviewed the triage vital signs and the nursing notes.  Pertinent labs & imaging results that were available during my care of the patient were reviewed by me and considered in my medical decision making (see chart for details).  Pt is feeling much better after IVFs.  He was able to keep down some soup.  He does have the flu, but is not in a high risk category.  He is instructed to alternate tylenol and ibuprofen.  Return if worse.  Final Clinical Impressions(s) / ED Diagnoses   Final diagnoses:  Dehydration  Non-intractable vomiting with nausea, unspecified vomiting type  Fever in adult  Influenza A    ED Discharge Orders         Ordered    promethazine (PHENERGAN) 25 MG tablet  Every 6 hours PRN     11/06/18 2145           Jacalyn Lefevre, MD 11/06/18 2146

## 2019-12-19 ENCOUNTER — Encounter (HOSPITAL_COMMUNITY): Payer: Self-pay

## 2019-12-19 ENCOUNTER — Emergency Department (HOSPITAL_COMMUNITY)
Admission: EM | Admit: 2019-12-19 | Discharge: 2019-12-19 | Disposition: A | Discharge status: Home / Self Care | Payer: Self-pay | Attending: Emergency Medicine | Admitting: Emergency Medicine

## 2019-12-19 ENCOUNTER — Other Ambulatory Visit: Payer: Self-pay

## 2019-12-19 DIAGNOSIS — J039 Acute tonsillitis, unspecified: Secondary | ICD-10-CM | POA: Insufficient documentation

## 2019-12-19 LAB — GROUP A STREP BY PCR: Group A Strep by PCR: NOT DETECTED

## 2019-12-19 MED ORDER — AMOXICILLIN 500 MG PO CAPS: 500.0000 mg | ORAL_CAPSULE | Freq: Three times a day (TID) | ORAL | 0 refills | Status: AC

## 2019-12-19 NOTE — ED Provider Notes (Signed)
[facility] EMERGENCY DEPARTMENT Provider Note   CSN: [id number] Arrival date & time: 12/19/19  3329     History Chief Complaint  Patient presents with  . Sore Throat    Jim Shaffer is a 24 y.o. male.  HPI Presents for evaluation of sore throat, with nasal congestion, and hoarseness of his voice, for about a week.  He is using over-the-counter medications without relief.  No known sick contacts.  No chest pain, fever, chills, nausea or vomiting.  There are no other known modifying factors.    History reviewed. No pertinent past medical history.  There are no problems to display for this patient.   Past Surgical History:  Procedure Laterality Date  . NO PAST SURGERIES    . PECTORALIS TENDON REPAIR Right 09/08/2016   Procedure: RIGHT PECTORALIS TENDON REPAIR;  Surgeon: Renette Butters, MD;  Location: [place] [facility];  Service: Orthopedics;  Laterality: Right;       No family history on file.  Social History   Tobacco Use  . Smoking status: Never Smoker  . Smokeless tobacco: Never Used  Substance Use Topics  . Alcohol use: No  . Drug use: No    Home Medications Prior to Admission medications   Medication Sig Start Date End Date Taking? Authorizing Provider  amoxicillin (AMOXIL) 500 MG capsule Take 1 capsule (500 mg total) by mouth 3 (three) times daily. 12/19/19   Daleen Bo, MD  HYDROmorphone (DILAUDID) 2 MG tablet Take 1 tablet (2 mg total) by mouth 2 (two) times daily as needed for severe pain (For Breakthrough pain only). Patient not taking: Reported on 11/06/2018 09/08/16   Prudencio Burly III, PA-C  ibuprofen (ADVIL,MOTRIN) 200 MG tablet Take 400 mg by mouth every 6 (six) hours as needed for fever or headache.    [provider]  ibuprofen (ADVIL,MOTRIN) 600 MG tablet Take 1 tablet (600 mg total) by mouth every 6 (six) hours as needed. Patient not taking: Reported on 11/06/2018 07/26/16   Recardo Evangelist, PA-C   methocarbamol (ROBAXIN) 500 MG tablet Take 1 tablet (500 mg total) by mouth every 6 (six) hours as needed for muscle spasms. Patient not taking: Reported on 11/06/2018 09/08/16   Prudencio Burly III, PA-C  ondansetron (ZOFRAN) 4 MG tablet Take 1 tablet (4 mg total) by mouth every 8 (eight) hours as needed for nausea or vomiting. Patient not taking: Reported on 11/06/2018 09/08/16   Prudencio Burly III, PA-C  oxyCODONE-acetaminophen (ROXICET) 5-325 MG tablet Take 1-2 tablets by mouth every 4 (four) hours as needed for severe pain. Patient not taking: Reported on 11/06/2018 09/08/16   Prudencio Burly III, PA-C  promethazine (PHENERGAN) 25 MG tablet Take 1 tablet (25 mg total) by mouth every 6 (six) hours as needed for nausea or vomiting. 11/06/18   Isla Pence, MD    Allergies    Patient has no known allergies.  Review of Systems   Review of Systems  All other systems reviewed and are negative.   Physical Exam Updated Vital Signs BP 134/67 (BP Location: Left Arm)   Pulse 92   Temp 99.2 F (37.3 C) (Oral)   Resp 12   Ht 6\' 4"  (1.93 m)   SpO2 100%   BMI 20.57 kg/m   Physical Exam Constitutional:      General: He is not in acute distress.    Appearance: He is not ill-appearing, toxic-appearing or diaphoretic.  HENT:     Head:  Normocephalic and atraumatic.     Nose: Congestion present.     Mouth/Throat:     Mouth: No oral lesions.     Tonsils: Tonsillar exudate present. No tonsillar abscesses. 2+ on the right. 2+ on the left.  Eyes:     Conjunctiva/sclera: Conjunctivae normal.  Neck:     Thyroid: No thyromegaly.  Cardiovascular:     Rate and Rhythm: Normal rate.     Heart sounds: No murmur.  Pulmonary:     Effort: No respiratory distress.     Breath sounds: Normal breath sounds. No stridor.  Abdominal:     General: There is no distension.     Palpations: There is no mass.     Tenderness: There is no guarding.     Comments: No hepatosplenomegaly   Lymphadenopathy:     Cervical: No cervical adenopathy.  Neurological:     General: No focal deficit present.     Mental Status: He is alert and oriented to person, place, and time.  Psychiatric:        Mood and Affect: Mood normal.        Behavior: Behavior normal.     ED Results / Procedures / Treatments   Labs (all labs ordered are listed, but only abnormal results are displayed) Labs Reviewed  GROUP A STREP BY PCR    EKG None  Radiology No results found.  Procedures Procedures (including critical care time)  Medications Ordered in ED Medications - No data to display  ED Course  I have reviewed the triage vital signs and the nursing notes.  Pertinent labs & imaging results that were available during my care of the patient were reviewed by me and considered in my medical decision making (see chart for details).    MDM Rules/Calculators/A&P                       Patient Vitals for the past 24 hrs:  BP Temp Temp src Pulse Resp SpO2 Height  12/19/19 0755 - - - - - - 6\' 4"  (1.93 m)  12/19/19 0753 134/67 99.2 F (37.3 C) Oral 92 12 100 % -    9:28 AM Reevaluation with update and discussion. After initial assessment and treatment, an updated evaluation reveals no change in clinical status, findings discussed and questions answered. 12/21/19   Medical Decision Making:  This patient is presenting for evaluation of sore throat, nasal congestion and hoarseness, which does require a range of treatment options, and is a complaint that involves a low risk of morbidity and mortality. The differential diagnoses include Epstein-Barr virus, Streptococcus, peritonsillar abscess. I decided did  to review old records, and in summary healthy young male without immunocompromise. I did not additional historical information from anyone. Clinical Laboratory Tests Ordered, included PCR strep test..  Result reviewed and negative   Critical Interventions-clinical evaluation   After These Interventions, the Patient was reevaluated and was found stable for discharge.  Constellation of symptoms is more likely viral than bacterial.  No evidence for peri- tonsillar abscess.  Doubt metabolic instability or impending vascular collapse.  Patient understands that this is more likely viral than bacterial, and that he may have EBV.  He is given a prescription for amoxicillin because of the potential for false negative streptococcal testing.  CRITICAL CARE-no  Nursing Notes Reviewed/ Care Coordinated Applicable Imaging Reviewed Interpretation of Laboratory Data incorporated into ED treatment  The patient appears reasonably screened and/or stabilized for discharge and  I doubt any other medical condition or other [facility] requiring further screening, evaluation, or treatment in the ED at this time prior to discharge.  Plan: Home Medications-continue OTC symptomatic treatment; Home Treatments-rest, fluids, gradually advance activity; return here if the recommended treatment, does not improve the symptoms; Recommended follow up-PCP, as needed  Performed by: Mancel Bale   Final Clinical Impression(s) / ED Diagnoses Final diagnoses:  Tonsillitis    Rx / DC Orders ED Discharge Orders         Ordered    amoxicillin (AMOXIL) 500 MG capsule  3 times daily     12/19/19 9798           Mancel Bale, MD 12/19/19 [phone]

## 2019-12-19 NOTE — ED Triage Notes (Signed)
Pt reports sore throat for 1 week, denies fever

## 2019-12-19 NOTE — Discharge Instructions (Addendum)
Your tonsils appear infected.  The strep test was negative, which may be a false negative, or you might have mononucleosis.  This is caused by the Epstein-Barr virus.  We are going to treat you with antibiotic in case this was a false negative strep test.

## 2019-12-19 NOTE — ED Notes (Signed)
Patient verbalized understanding of dc instructions, vss, ambulatory with nad.   

## 2019-12-22 ENCOUNTER — Other Ambulatory Visit: Payer: Self-pay

## 2019-12-22 ENCOUNTER — Emergency Department (HOSPITAL_COMMUNITY)
Admission: EM | Admit: 2019-12-22 | Discharge: 2019-12-22 | Disposition: A | Discharge status: Home / Self Care | Payer: 59 | Attending: Emergency Medicine | Admitting: Emergency Medicine

## 2019-12-22 ENCOUNTER — Encounter (HOSPITAL_COMMUNITY): Payer: Self-pay | Admitting: Emergency Medicine

## 2019-12-22 DIAGNOSIS — Z5321 Procedure and treatment not carried out due to patient leaving prior to being seen by health care provider: Secondary | ICD-10-CM | POA: Diagnosis not present

## 2019-12-22 DIAGNOSIS — R509 Fever, unspecified: Secondary | ICD-10-CM | POA: Diagnosis not present

## 2019-12-22 DIAGNOSIS — J029 Acute pharyngitis, unspecified: Secondary | ICD-10-CM | POA: Diagnosis not present

## 2019-12-22 MED ORDER — ACETAMINOPHEN 325 MG PO TABS
650.0000 mg | ORAL_TABLET | Freq: Once | ORAL | Status: AC | PRN
  Administered 2019-12-22: 18:00:00 650 mg via ORAL
  Filled 2019-12-22: qty 2

## 2019-12-22 NOTE — ED Triage Notes (Signed)
C/o sore throat since Thursday.  States he was seen here and told strep negative.  Went to [facility] and had fever 104.3.  Sent to ED for further eval.

## 2019-12-23 ENCOUNTER — Emergency Department (HOSPITAL_COMMUNITY): Payer: 59

## 2019-12-23 ENCOUNTER — Encounter (HOSPITAL_COMMUNITY): Payer: Self-pay

## 2019-12-23 ENCOUNTER — Emergency Department (HOSPITAL_COMMUNITY)
Admission: EM | Admit: 2019-12-23 | Discharge: 2019-12-23 | Disposition: A | Discharge status: Home / Self Care | Payer: 59 | Attending: Emergency Medicine | Admitting: Emergency Medicine

## 2019-12-23 DIAGNOSIS — R509 Fever, unspecified: Secondary | ICD-10-CM | POA: Diagnosis not present

## 2019-12-23 DIAGNOSIS — R590 Localized enlarged lymph nodes: Secondary | ICD-10-CM | POA: Diagnosis not present

## 2019-12-23 DIAGNOSIS — R5383 Other fatigue: Secondary | ICD-10-CM | POA: Diagnosis not present

## 2019-12-23 DIAGNOSIS — J029 Acute pharyngitis, unspecified: Secondary | ICD-10-CM

## 2019-12-23 DIAGNOSIS — R1310 Dysphagia, unspecified: Secondary | ICD-10-CM | POA: Diagnosis not present

## 2019-12-23 LAB — CBC WITH DIFFERENTIAL/PLATELET
Abs Immature Granulocytes: 0.03 10*3/uL (ref 0.00–0.07)
Basophils Absolute: 0.1 10*3/uL (ref 0.0–0.1)
Basophils Relative: 1 %
Eosinophils Absolute: 0.1 10*3/uL (ref 0.0–0.5)
Eosinophils Relative: 1 %
HCT: 49.8 % (ref 39.0–52.0)
Hemoglobin: 16.2 g/dL (ref 13.0–17.0)
Immature Granulocytes: 0 %
Lymphocytes Relative: 44 %
Lymphs Abs: 4.7 10*3/uL — ABNORMAL HIGH (ref 0.7–4.0)
MCH: 29.3 pg (ref 26.0–34.0)
MCHC: 32.5 g/dL (ref 30.0–36.0)
MCV: 90.1 fL (ref 80.0–100.0)
Monocytes Absolute: 1.6 10*3/uL — ABNORMAL HIGH (ref 0.1–1.0)
Monocytes Relative: 15 %
Neutro Abs: 4.1 10*3/uL (ref 1.7–7.7)
Neutrophils Relative %: 39 %
Platelets: 185 10*3/uL (ref 150–400)
RBC: 5.53 MIL/uL (ref 4.22–5.81)
RDW: 13.4 % (ref 11.5–15.5)
WBC: 10.6 10*3/uL — ABNORMAL HIGH (ref 4.0–10.5)
nRBC: 0 % (ref 0.0–0.2)

## 2019-12-23 LAB — BASIC METABOLIC PANEL
Anion gap: 12 (ref 5–15)
BUN: 16 mg/dL (ref 6–20)
CO2: 29 mmol/L (ref 22–32)
Calcium: 9.5 mg/dL (ref 8.9–10.3)
Chloride: 97 mmol/L — ABNORMAL LOW (ref 98–111)
Creatinine, Ser: 1.23 mg/dL (ref 0.61–1.24)
GFR calc Af Amer: >60 mL/min (ref >60)
GFR calc non Af Amer: >60 mL/min (ref >60)
Glucose, Bld: 96 mg/dL (ref 70–99)
Potassium: 4.7 mmol/L (ref 3.5–5.1)
Sodium: 138 mmol/L (ref 135–145)

## 2019-12-23 LAB — MONONUCLEOSIS SCREEN: Mono Screen: NEGATIVE

## 2019-12-23 MED ORDER — IOHEXOL 300 MG/ML  SOLN
75.0000 mL | Freq: Once | INTRAMUSCULAR | Status: AC | PRN
  Administered 2019-12-23: 75 mL via INTRAVENOUS

## 2019-12-23 MED ORDER — LIDOCAINE VISCOUS HCL 2 % MT SOLN
15.0000 mL | Freq: Once | OROMUCOSAL | Status: AC
  Administered 2019-12-23: 13:00:00 15 mL via OROMUCOSAL
  Filled 2019-12-23: qty 15

## 2019-12-23 MED ORDER — SODIUM CHLORIDE (PF) 0.9 % IJ SOLN
INTRAMUSCULAR | Status: AC
  Filled 2019-12-23: qty 50

## 2019-12-23 MED ORDER — DEXAMETHASONE SODIUM PHOSPHATE 10 MG/ML IJ SOLN
10.0000 mg | Freq: Once | INTRAMUSCULAR | Status: AC
  Administered 2019-12-23: 10 mg via INTRAVENOUS
  Filled 2019-12-23: qty 1

## 2019-12-23 MED ORDER — PREDNISONE 10 MG PO TABS: 40.0000 mg | ORAL_TABLET | Freq: Every day | ORAL | 0 refills | Status: AC

## 2019-12-23 NOTE — ED Notes (Signed)
An After Visit Summary was printed and given to the patient. Discharge instructions given and no further questions at this time.  

## 2019-12-23 NOTE — ED Provider Notes (Signed)
[facility]-EMERGENCY DEPT Provider Note   CSN: [id number] Arrival date & time: 12/23/19  3383     History Chief Complaint  Patient presents with  . Sore Throat  . Fever  . Headache    Jim Shaffer is a 24 y.o. male.  Patient is a 24 year old gentleman with no past medical history presenting to the emergency department for fever and sore throat.  This is been going on for about 1 week.  Patient was seen in the emergency department for the same 4 days ago.  He had a strep test which was negative but was started on amoxicillin.  He reports he has been taking amoxicillin regularly since Thursday and his symptoms have actually worsened.  He reports pain with swallowing, fever to 104 yesterday.  He denies any shortness of breath, chest pain.        History reviewed. No pertinent past medical history.  There are no problems to display for this patient.   Past Surgical History:  Procedure Laterality Date  . NO PAST SURGERIES    . PECTORALIS TENDON REPAIR Right 09/08/2016   Procedure: RIGHT PECTORALIS TENDON REPAIR;  Surgeon: Sheral Apley, MD;  [facility];  Service: Orthopedics;  Laterality: Right;       History reviewed. No pertinent family history.  Social History   Tobacco Use  . Smoking status: Never Smoker  . Smokeless tobacco: Never Used  Substance Use Topics  . Alcohol use: No  . Drug use: No    Home Medications Prior to Admission medications   Medication Sig Start Date End Date Taking? Authorizing Provider  amoxicillin (AMOXIL) 500 MG capsule Take 1 capsule (500 mg total) by mouth 3 (three) times daily. 12/19/19   Mancel Bale, MD  HYDROmorphone (DILAUDID) 2 MG tablet Take 1 tablet (2 mg total) by mouth 2 (two) times daily as needed for severe pain (For Breakthrough pain only). Patient not taking: Reported on 11/06/2018 09/08/16   Albina Billet III, PA-C  ibuprofen (ADVIL,MOTRIN) 200 MG tablet  Take 400 mg by mouth every 6 (six) hours as needed for fever or headache.    [provider]  ibuprofen (ADVIL,MOTRIN) 600 MG tablet Take 1 tablet (600 mg total) by mouth every 6 (six) hours as needed. Patient not taking: Reported on 11/06/2018 07/26/16   Bethel Born, PA-C  methocarbamol (ROBAXIN) 500 MG tablet Take 1 tablet (500 mg total) by mouth every 6 (six) hours as needed for muscle spasms. Patient not taking: Reported on 11/06/2018 09/08/16   Albina Billet III, PA-C  ondansetron (ZOFRAN) 4 MG tablet Take 1 tablet (4 mg total) by mouth every 8 (eight) hours as needed for nausea or vomiting. Patient not taking: Reported on 11/06/2018 09/08/16   Albina Billet III, PA-C  oxyCODONE-acetaminophen (ROXICET) 5-325 MG tablet Take 1-2 tablets by mouth every 4 (four) hours as needed for severe pain. Patient not taking: Reported on 11/06/2018 09/08/16   Albina Billet III, PA-C  promethazine (PHENERGAN) 25 MG tablet Take 1 tablet (25 mg total) by mouth every 6 (six) hours as needed for nausea or vomiting. 11/06/18   Jacalyn Lefevre, MD    Allergies    Patient has no known allergies.  Review of Systems   Review of Systems  Constitutional: Positive for appetite change, chills, fatigue and fever.  HENT: Positive for sore throat and trouble swallowing. Negative for congestion, mouth sores, postnasal drip and rhinorrhea.   Eyes: Negative  for visual disturbance.  Respiratory: Negative for cough and shortness of breath.   Cardiovascular: Negative for chest pain.  Gastrointestinal: Negative for abdominal pain, diarrhea, nausea and vomiting.  Musculoskeletal: Negative for back pain.  Skin: Negative for rash.  Allergic/Immunologic: Negative for immunocompromised state.  Neurological: Negative for dizziness.  Hematological: Does not bruise/bleed easily.  Psychiatric/Behavioral: Negative for confusion.    Physical Exam Updated Vital Signs BP (!) 147/74 (BP Location: Left  Arm)   Pulse 93   Temp 98.4 F (36.9 C) (Oral)   Resp 17   SpO2 100%   Physical Exam Vitals and nursing note reviewed.  Constitutional:      General: He is not in acute distress.    Appearance: Normal appearance. He is well-developed and well-groomed. He is not ill-appearing, toxic-appearing or diaphoretic.  HENT:     Head: Normocephalic.     Nose: No congestion.     Mouth/Throat:     Mouth: Mucous membranes are moist. No oral lesions.     Pharynx: Uvula midline. Pharyngeal swelling, oropharyngeal exudate and posterior oropharyngeal erythema present. No uvula swelling.     Tonsils: Tonsillar exudate present. No tonsillar abscesses.  Eyes:     Conjunctiva/sclera: Conjunctivae normal.  Cardiovascular:     Rate and Rhythm: Normal rate.  Pulmonary:     Effort: Pulmonary effort is normal.  Lymphadenopathy:     Cervical: Cervical adenopathy present.     Right cervical: Superficial cervical adenopathy present.     Left cervical: Superficial cervical adenopathy present.  Skin:    General: Skin is dry.  Neurological:     Mental Status: He is alert.  Psychiatric:        Mood and Affect: Mood normal.     ED Results / Procedures / Treatments   Labs (all labs ordered are listed, but only abnormal results are displayed) Labs Reviewed  CBC WITH DIFFERENTIAL/PLATELET  BASIC METABOLIC PANEL  MONONUCLEOSIS SCREEN    EKG None  Radiology No results found.  Procedures Procedures (including critical care time)  Medications Ordered in ED Medications  lidocaine (XYLOCAINE) 2 % viscous mouth solution 15 mL (15 mLs Mouth/Throat Given 12/23/19 1328)  dexamethasone (DECADRON) injection 10 mg (10 mg Intravenous Given 12/23/19 1328)    ED Course  I have reviewed the triage vital signs and the nursing notes.  Pertinent labs & imaging results that were available during my care of the patient were reviewed by me and considered in my medical decision making (see chart for  details).  Clinical Course as of Dec 23 1954  Mon Dec 23, 2019  1655 Patient presenting with persistent sore throat, fever, despite taking amoxicillin. Labs reassuring and mono test negative. Given the degree of swelling and erythema a soft tissue Ct was ordered and shows signs of tonsillitis without airway compromise and no peritonsillar abscess. Suspect viral tonsillitis. Patient given viscous lidocaine as well as decadron. Will continue to treat with steroids at home. Patient overall well appearing and tolerating PO. Advised on return precautions.    [KM]    Clinical Course User Index [KM] Kristine Royal   MDM Rules/Calculators/A&P                      Based on review of vitals, medical screening exam, lab work and/or imaging, there does not appear to be an acute, emergent etiology for the patient's symptoms. Counseled pt on good return precautions and encouraged both PCP and ED follow-up as needed.  Prior to discharge, I also discussed incidental imaging findings with patient in detail and advised appropriate, recommended follow-up in detail.  Clinical Impression: 1. Pharyngitis, unspecified etiology     Disposition: Discharge  Prior to providing a prescription for a controlled substance, I independently reviewed the patient's recent prescription history on the [place] Controlled Substance Reporting System. The patient had no recent or regular prescriptions and was deemed appropriate for a brief, less than 3 day prescription of narcotic for acute analgesia.  This note was prepared with assistance of Conservation officer, historic buildings. Occasional wrong-word or sound-a-like substitutions may have occurred due to the inherent limitations of voice recognition software.  Final Clinical Impression(s) / ED Diagnoses Final diagnoses:  None    Rx / DC Orders ED Discharge Orders    None       Jeral Pinch 12/23/19 1956    Derwood Kaplan, MD 12/24/19  [phone]

## 2019-12-23 NOTE — ED Triage Notes (Addendum)
Patient c/o sore throat, headache, and fever X1 week ago.   8/10 sore throat   Patient reports fever of 104 at home.   Patient was seen at [facility] last week and tested negative for strep.  Patient went to UC yesterday and was told he probably has strep throat and told to go to ED.   Patient reports taking amoxicillin, tylenol, and ibuprofen.   A/Ox4 Ambulatory in triage

## 2019-12-23 NOTE — Discharge Instructions (Signed)
You are seen today for sore throat.  Your work-up was reassuring including a CT scan.  You likely have tonsillitis and pharyngitis which is caused from a virus.  We have given you medicines in your IV to help with pain and swelling.  We will give you a prescription that will continue to help with this.  You can continue to take the amoxicillin. Thank you for allowing me to care for you today. Please return to the emergency department if you have new or worsening symptoms. Take your medications as instructed.

## 2019-12-29 ENCOUNTER — Emergency Department (HOSPITAL_COMMUNITY)
Admission: EM | Admit: 2019-12-29 | Discharge: 2019-12-29 | Disposition: A | Discharge status: Home / Self Care | Payer: 59 | Attending: Emergency Medicine | Admitting: Emergency Medicine

## 2019-12-29 DIAGNOSIS — L509 Urticaria, unspecified: Secondary | ICD-10-CM | POA: Diagnosis present

## 2019-12-29 DIAGNOSIS — Z5321 Procedure and treatment not carried out due to patient leaving prior to being seen by health care provider: Secondary | ICD-10-CM | POA: Insufficient documentation

## 2019-12-29 NOTE — ED Triage Notes (Signed)
Pt reports he was seen here for sore throat on 4/19 and started on prednisone. reports for past 2 days had facial swelling. Today noticed rash on arms, back, legs-all over that started today. Denies itching. Took Benadryl last night and on Friday for face issues.

## 2019-12-29 NOTE — ED Notes (Signed)
No answer from lobby  

## 2021-06-20 IMAGING — CT CT NECK W/ CM
3 of 4 series · 13 of 33 positions shown, 16 images · IV contrast (omnipaque)
Comparison: No pertinent prior studies available for comparison.

CLINICAL DATA: Epiglottitis or tonsillitis suspected. Additional
history provided: Sore throat, headache and fever for 1 week.

EXAM:
CT NECK WITH CONTRAST
TECHNIQUE: Multidetector CT imaging of the neck was performed using the
standard protocol following the bolus administration of intravenous
contrast.
CONTRAST:  75mL OMNIPAQUE IOHEXOL 300 MG/ML  SOLN

[Series 2: axial neck · axial · 0.54mm/px · z∈[-315,-165]mm · 5 of 113 slices shown, 7 images]
[im 19/113  soft-tissue]
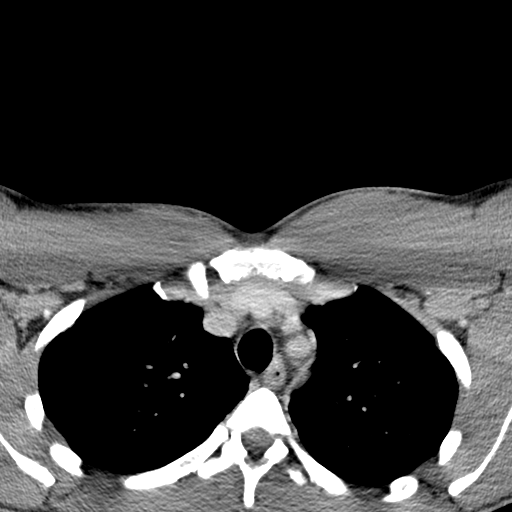
[im 19/113  bone]
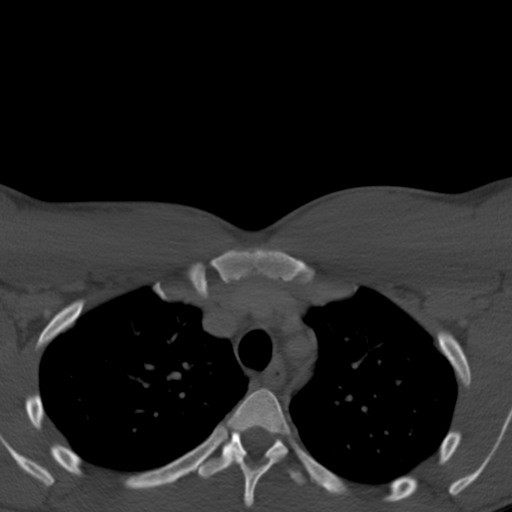
[im 38/113  bone]
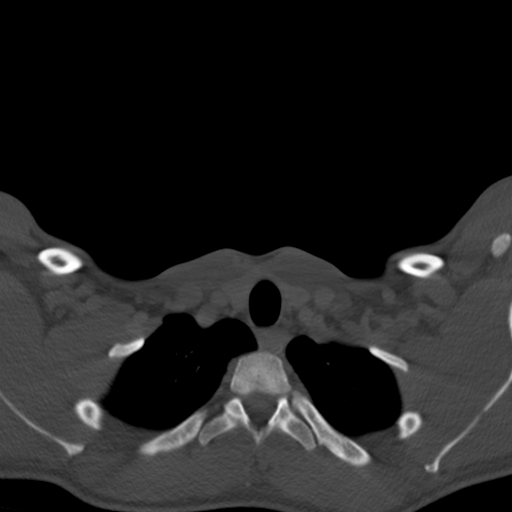
[im 57/113  bone]
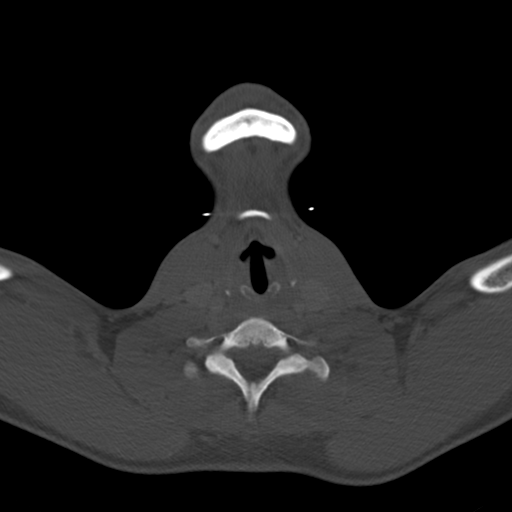
[im 75/113  bone]
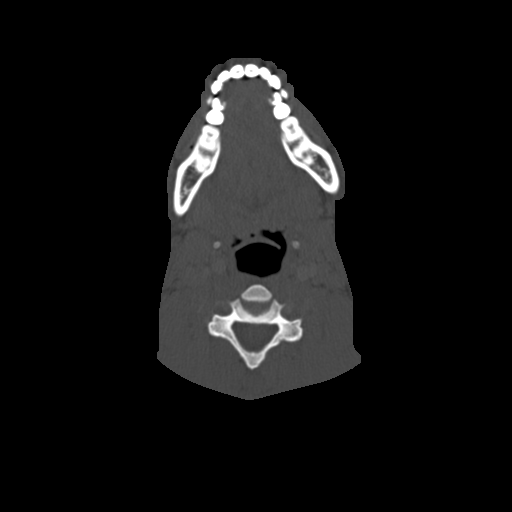
[im 94/113  soft-tissue]
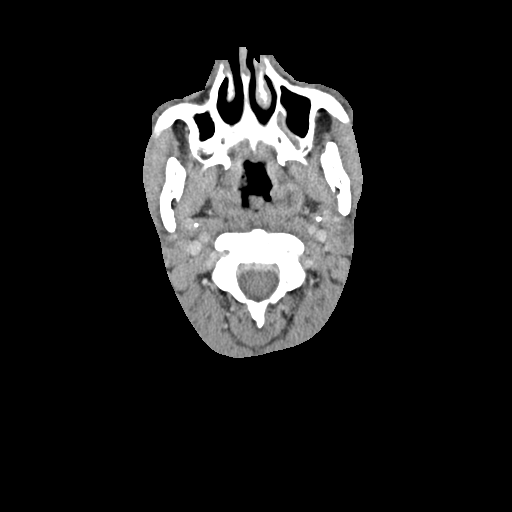
[im 94/113  bone]
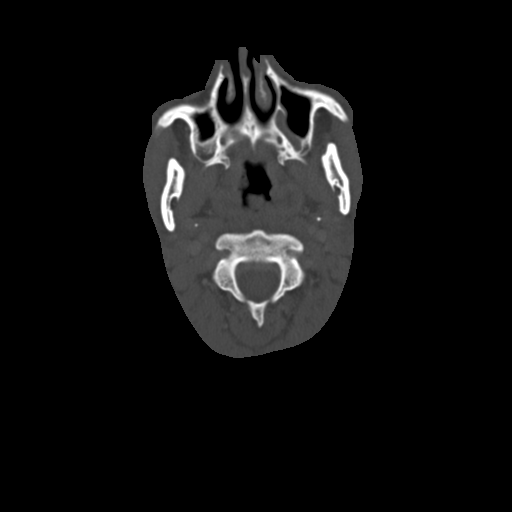

[Series 5: coronal · coronal · 0.49mm/px · 3 of 131 slices shown]
[im 27/131  bone]
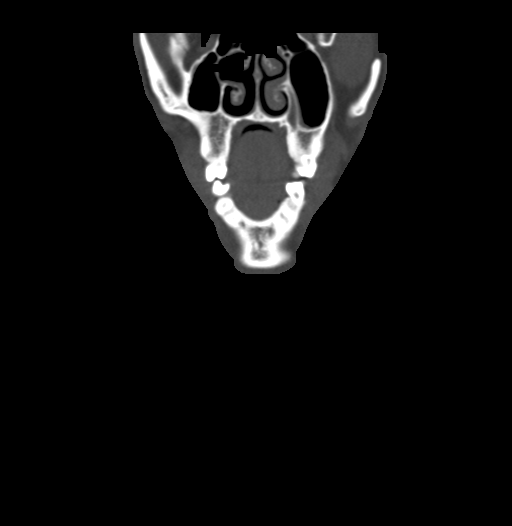
[im 53/131  bone]
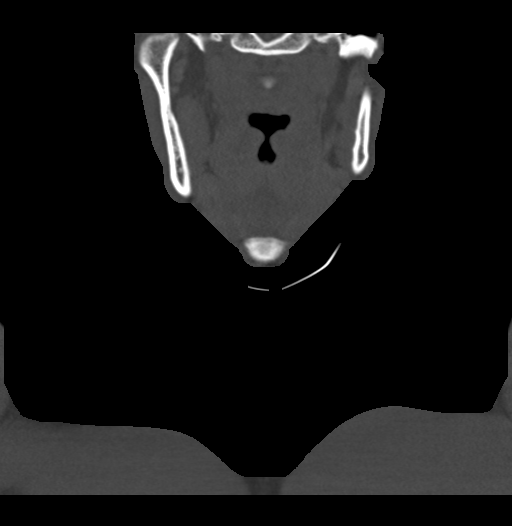
[im 79/131  bone]
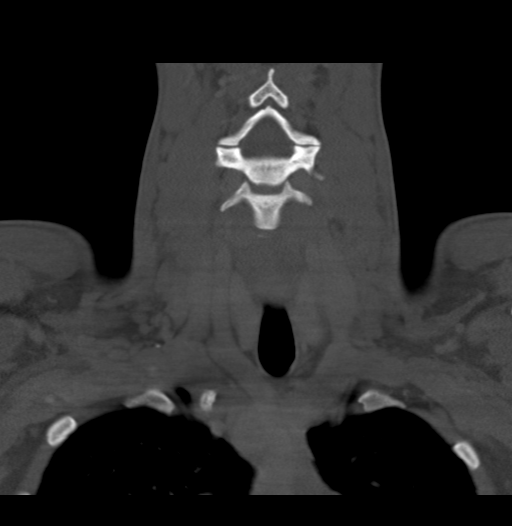

[Series 6: sagittal · sagittal · 0.49mm/px · 5 of 101 slices shown, 6 images]
[im 34/101  bone]
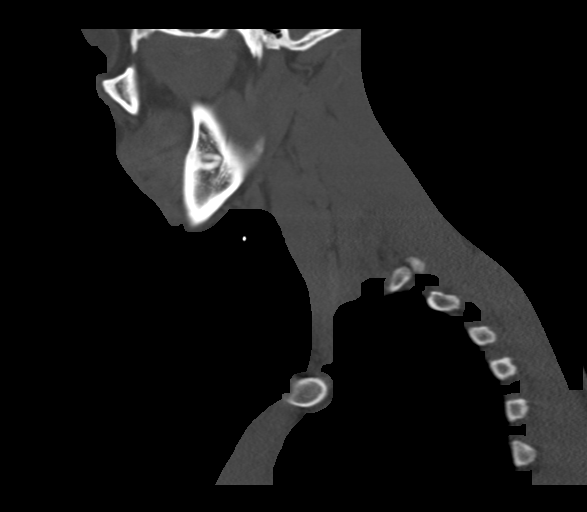
[im 42/101  bone]
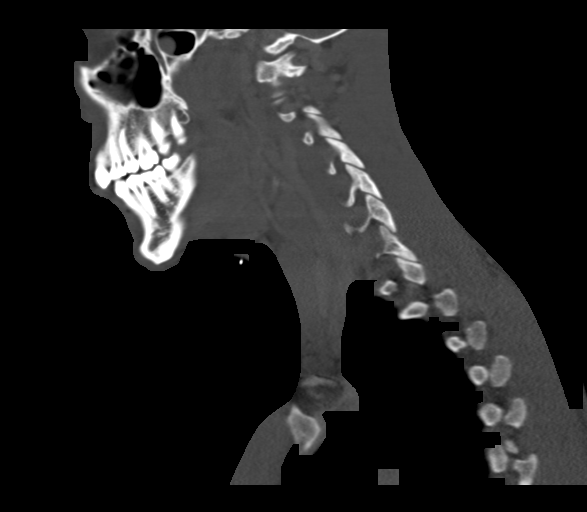
[im 51/101  soft-tissue]
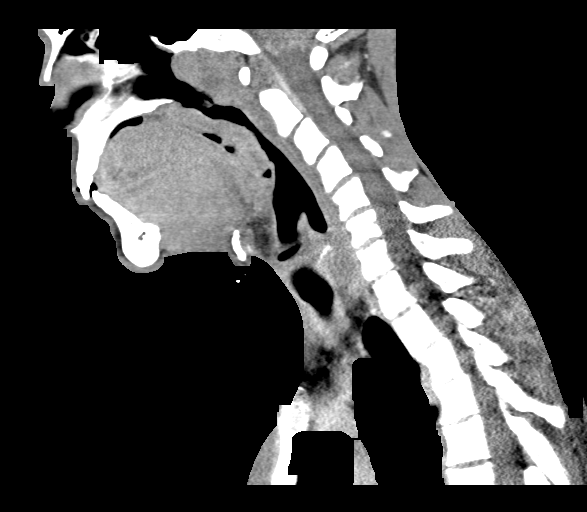
[im 51/101  bone]
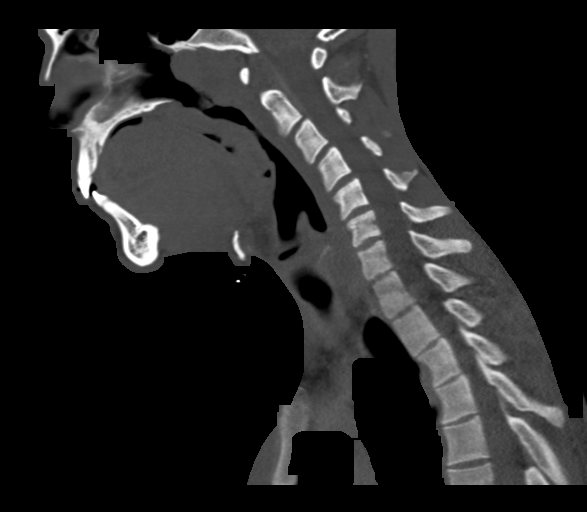
[im 59/101  bone]
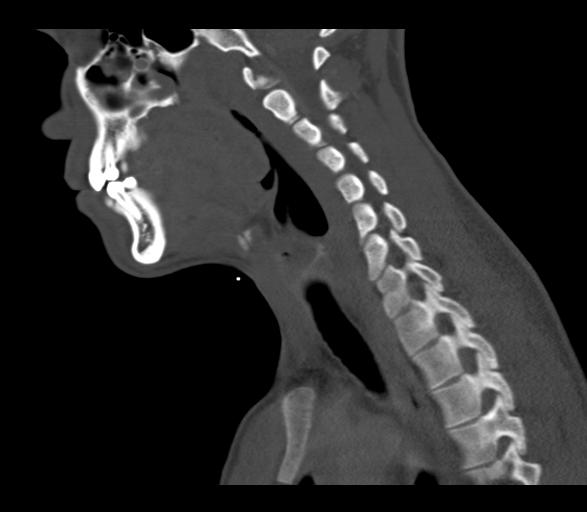
[im 67/101  bone]
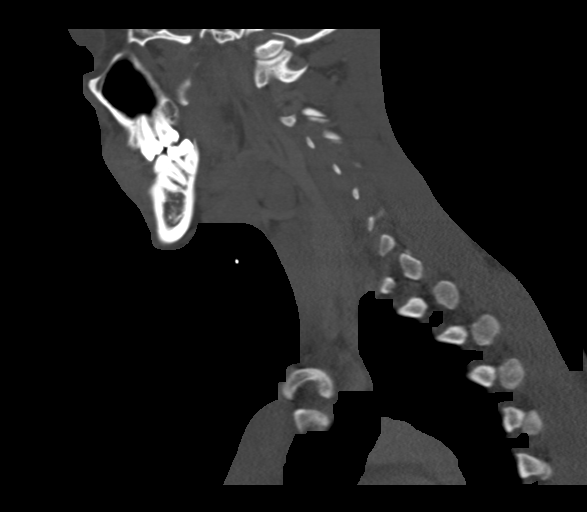

[13 of 33 positions shown; findings below may reference images not displayed]

FINDINGS: Pharynx and larynx: There is prominent swelling and edema of the
posterior nasopharyngeal soft tissues. Associated symmetric
prominence of the palatine tonsils. Given the provided history,
findings are favored infectious reflecting pharyngitis and
tonsillitis. No formed peritonsillar abscess is demonstrated.
Partial effacement of the nasopharyngeal airway. The oropharyngeal
airway is patent. There is no significant retropharyngeal edema. No
retropharyngeal collection. The epiglottis is unremarkable.

Salivary glands: No inflammation, mass, or stone.

Thyroid: Unremarkable.

Lymph nodes: There is prominent bilateral cervical chain
lymphadenopathy. Most notably, a left level 2 lymph node measures
2.3 cm in short axis.

Vascular: The major vascular structures of the neck appear patent.

Limited intracranial: No abnormality identified within included
portions of the posterior fossa.

Visualized orbits: Incompletely imaged. Visualized orbits
demonstrate no acute abnormality.

Mastoids and visualized paranasal sinuses: Visualized portions
demonstrate a small right sphenoid sinus mucous retention cyst.
Trace left maxillary sinus mucosal thickening. No significant
mastoid effusion. Presumed chronic deformity of the right nasal
bone.

Skeleton: No acute bony abnormality or aggressive osseous lesion.
Nonspecific reversal of the expected cervical lordosis.

Upper chest: No consolidation within the imaged lung apices.
IMPRESSION: Prominent swelling and edema of the posterior nasopharyngeal soft
tissues. Associated symmetric prominence of the palatine tonsils. No
formed peritonsillar abscess is demonstrated on the current exam.
Narrowing of the nasopharyngeal airway with the oropharyngeal airway
widely patent. Prominent bilateral cervical chain lymphadenopathy.
Given the provided history, findings are favored infectious in
etiology reflecting pharyngitis and tonsillitis with reactive
lymphadenopathy. Close clinical follow-up is recommended with repeat
imaging as warranted to exclude alternative etiologies (i.e. mass,
lymphoma).

Mild paranasal sinus mucosal thickening at the imaged levels. Small
right sphenoid sinus mucous retention cyst.

## 2023-01-25 ENCOUNTER — Emergency Department (HOSPITAL_BASED_OUTPATIENT_CLINIC_OR_DEPARTMENT_OTHER)
Admission: EM | Admit: 2023-01-25 | Discharge: 2023-01-25 | Disposition: A | Discharge status: Home / Self Care | Payer: 59 | Attending: Emergency Medicine | Admitting: Emergency Medicine

## 2023-01-25 ENCOUNTER — Other Ambulatory Visit: Payer: Self-pay

## 2023-01-25 ENCOUNTER — Encounter (HOSPITAL_BASED_OUTPATIENT_CLINIC_OR_DEPARTMENT_OTHER): Payer: Self-pay | Admitting: Emergency Medicine

## 2023-01-25 DIAGNOSIS — M79671 Pain in right foot: Secondary | ICD-10-CM | POA: Insufficient documentation

## 2023-01-25 NOTE — Discharge Instructions (Addendum)
Please follow up with the orthopedist for further evaluation of your right foot pain.

## 2023-01-25 NOTE — ED Provider Notes (Signed)
[facility] AT [facility] Provider Note   CSN: [id number] Arrival date & time: 01/25/23  1403     History  Chief Complaint  Patient presents with   Foot Pain    Jim Shaffer is a 27 y.o. male.  27 y.o male with no PMH presents to the ED with a chief complaint of right foot pain x 3 weeks. Patient reports he was in [place] playing basketball when he went up for the ball and landed on the right foot feeling a pop sensation to the posterior aspect of his foot. He has been ambulating since the injury occurred, as this happened and brought in [place].  He did have a x-ray did not show any fractures.  He has been taking some anti-inflammatories for improvement in his pain.  He reports "I am here because my parents wanted me to get checked out.  He has not followed up with any specialist.  He denies any tingling, no other complaints or injuries reported.  The history is provided by the patient.  Foot Pain       Home Medications Prior to Admission medications   Medication Sig Start Date End Date Taking? Authorizing Provider  amoxicillin (AMOXIL) 500 MG capsule Take 1 capsule (500 mg total) by mouth 3 (three) times daily. 12/19/19   Mancel Bale, MD  HYDROmorphone (DILAUDID) 2 MG tablet Take 1 tablet (2 mg total) by mouth 2 (two) times daily as needed for severe pain (For Breakthrough pain only). Patient not taking: Reported on 11/06/2018 09/08/16   Albina Billet III, PA-C  ibuprofen (ADVIL,MOTRIN) 200 MG tablet Take 400 mg by mouth every 6 (six) hours as needed for fever or headache.    [provider]  ibuprofen (ADVIL,MOTRIN) 600 MG tablet Take 1 tablet (600 mg total) by mouth every 6 (six) hours as needed. Patient not taking: Reported on 11/06/2018 07/26/16   Bethel Born, PA-C  methocarbamol (ROBAXIN) 500 MG tablet Take 1 tablet (500 mg total) by mouth every 6 (six) hours as needed for muscle spasms. Patient not taking:  Reported on 11/06/2018 09/08/16   Albina Billet III, PA-C  ondansetron (ZOFRAN) 4 MG tablet Take 1 tablet (4 mg total) by mouth every 8 (eight) hours as needed for nausea or vomiting. Patient not taking: Reported on 11/06/2018 09/08/16   Albina Billet III, PA-C  oxyCODONE-acetaminophen (ROXICET) 5-325 MG tablet Take 1-2 tablets by mouth every 4 (four) hours as needed for severe pain. Patient not taking: Reported on 11/06/2018 09/08/16   Albina Billet III, PA-C  promethazine (PHENERGAN) 25 MG tablet Take 1 tablet (25 mg total) by mouth every 6 (six) hours as needed for nausea or vomiting. 11/06/18   Jacalyn Lefevre, MD      Allergies    Patient has no known allergies.    Review of Systems   Review of Systems  Constitutional:  Negative for fever.  Musculoskeletal:  Positive for arthralgias.    Physical Exam Updated Vital Signs BP 100/70 (BP Location: Right Arm)   Pulse 89   Temp 98.9 F (37.2 C) (Oral)   Resp 18   Ht 6\' 1"  (1.854 m)   Wt 93 kg   SpO2 100%   BMI 27.05 kg/m  Physical Exam Vitals and nursing note reviewed.  Constitutional:      Appearance: Normal appearance.  HENT:     Head: Normocephalic and atraumatic.     Mouth/Throat:     Mouth: Mucous  membranes are moist.  Eyes:     Pupils: Pupils are equal, round, and reactive to light.  Cardiovascular:     Rate and Rhythm: Normal rate.     Pulses:          Dorsalis pedis pulses are 2+ on the right side.       Posterior tibial pulses are 2+ on the right side.  Pulmonary:     Effort: Pulmonary effort is normal.  Abdominal:     Palpations: Abdomen is soft.     Tenderness: There is no abdominal tenderness.  Musculoskeletal:     Cervical back: Normal range of motion and neck supple.  Feet:     Right foot:     Skin integrity: Skin integrity normal. No erythema, warmth or dry skin.     Comments: Full ROM with no pain, palpable 2+ PT, DP pulses, capillary refill is intact. Skin is intact.  Skin:     General: Skin is warm and dry.  Neurological:     Mental Status: He is alert and oriented to person, place, and time.     ED Results / Procedures / Treatments   Labs (all labs ordered are listed, but only abnormal results are displayed) Labs Reviewed - No data to display  EKG None  Radiology No results found.  Procedures Procedures    Medications Ordered in ED Medications - No data to display  ED Course/ Medical Decision Making/ A&P                             Medical Decision Making   Patient presents to the ED with right foot pain that is been ongoing for the past 3 weeks, reports injuring his right foot while in [place], had x-rays which did not show any acute fracture.  He continues to have pain along the posterior aspect of the foot exacerbated with ambulation.  His exam is benign, 2+ DP, PT pulses present, capillary refills intact.  Sensation is intact throughout.  Has good range of motion with flexion and extension of his right foot. Exam is benign.  We discussed appropriate follow-up with orthopedics.  He reports that he wanted to be checked out as parents recommended an MRI, I do not feel that this is warranted at this time as patient has been ambulating on it, and I doubt a complete rupture of his Achilles.  He will need to follow-up with orthopedics, referral given.  Patient stable for discharge.    Portions of this note were generated with Scientist, clinical (histocompatibility and immunogenetics). Dictation errors may occur despite best attempts at proofreading.   Final Clinical Impression(s) / ED Diagnoses Final diagnoses:  Right foot pain    Rx / DC Orders ED Discharge Orders     None         Claude Manges, PA-C 01/25/23 1650    Gwyneth Sprout, MD 01/26/23 [phone]

## 2023-01-25 NOTE — ED Triage Notes (Signed)
Pt via pov from home with heel injury 3 weeks ago while traveling to [place]. Pt reports that his pain continues. Pt alert & oriented, nad noted.
